# Patient Record
Sex: Female | Born: 1960 | Race: White | Hispanic: No | Marital: Married | Smoking: Never smoker
Health system: Southern US, Community
[De-identification: ages and names within clinical notes are randomized; demographics above are authoritative.]

## PROBLEM LIST (undated history)

## (undated) DIAGNOSIS — Z87442 Personal history of urinary calculi: Secondary | ICD-10-CM

## (undated) DIAGNOSIS — G4733 Obstructive sleep apnea (adult) (pediatric): Secondary | ICD-10-CM

## (undated) DIAGNOSIS — E785 Hyperlipidemia, unspecified: Secondary | ICD-10-CM

## (undated) DIAGNOSIS — I272 Pulmonary hypertension, unspecified: Secondary | ICD-10-CM

## (undated) DIAGNOSIS — I493 Ventricular premature depolarization: Secondary | ICD-10-CM

## (undated) DIAGNOSIS — G43909 Migraine, unspecified, not intractable, without status migrainosus: Secondary | ICD-10-CM

## (undated) HISTORY — PX: SALPINGOOPHORECTOMY: SHX82

## (undated) HISTORY — DX: Hyperlipidemia, unspecified: E78.5

## (undated) HISTORY — DX: Pulmonary hypertension, unspecified: I27.20

## (undated) HISTORY — DX: Obstructive sleep apnea (adult) (pediatric): G47.33

## (undated) HISTORY — PX: LITHOTRIPSY: SUR834

---

## 1997-11-28 ENCOUNTER — Ambulatory Visit (HOSPITAL_COMMUNITY): Admission: RE | Admit: 1997-11-28 | Discharge: 1997-11-28 | Payer: Self-pay | Admitting: Urology

## 2006-09-29 ENCOUNTER — Ambulatory Visit (HOSPITAL_COMMUNITY): Admission: RE | Admit: 2006-09-29 | Discharge: 2006-09-29 | Payer: Self-pay | Admitting: Family Medicine

## 2007-10-23 ENCOUNTER — Ambulatory Visit (HOSPITAL_COMMUNITY): Admission: RE | Admit: 2007-10-23 | Discharge: 2007-10-23 | Payer: Self-pay | Admitting: Family Medicine

## 2013-03-12 ENCOUNTER — Other Ambulatory Visit: Payer: Self-pay | Admitting: *Deleted

## 2013-03-12 ENCOUNTER — Telehealth: Payer: Self-pay | Admitting: Family Medicine

## 2013-03-12 MED ORDER — CIPROFLOXACIN HCL 500 MG PO TABS: 500.0000 mg | ORAL_TABLET | Freq: Two times a day (BID) | ORAL | Status: AC

## 2013-03-12 NOTE — Telephone Encounter (Signed)
Pt is calling and requesting some Cipro to be called into [facility], Eber Jones has told the pt that if she gets another UTI to just call in and get [place] to call her in some cipro. Pt says to see chart for history. Advised pt that Dr Brett Canales or Dr Lorin Picket will normally have you come in for an appt before issuing a Antibiotic, that she may need to come in for an appt.

## 2013-03-12 NOTE — Telephone Encounter (Signed)
Med sent into [facility]. Pt notified

## 2013-03-12 NOTE — Telephone Encounter (Signed)
cipro 250 bid for 7 d

## 2013-05-22 ENCOUNTER — Ambulatory Visit (INDEPENDENT_AMBULATORY_CARE_PROVIDER_SITE_OTHER): Payer: 59 | Admitting: Orthopedic Surgery

## 2013-05-22 ENCOUNTER — Ambulatory Visit (INDEPENDENT_AMBULATORY_CARE_PROVIDER_SITE_OTHER): Payer: 59

## 2013-05-22 VITALS — BP 133/73 | Ht 65.5 in | Wt 143.0 lb

## 2013-05-22 DIAGNOSIS — M2540 Effusion, unspecified joint: Secondary | ICD-10-CM

## 2013-05-22 NOTE — Patient Instructions (Addendum)
REFER TO DR. GRAMIG LEFT LONG FINGER PIP JOINT SWELLING AND PAIN

## 2013-05-24 NOTE — Progress Notes (Signed)
Patient ID: Loretta Carpenter, female   DOB: 1961/06/13, 52 y.o.   MRN: [id number]  Chief Complaint  Patient presents with  . Hand Pain    Swelling of joint left long finger, no injury    HISTORY: 52 yo female long history of OA has swelling of the left long finger, with ? Soft tissue mass over the volar aspect of the finger with pain and swelling of the PIP joint and a small amount of joint motion loss.  ROS as recorded    Vital signs are stable as recorded/BP 133/73  Ht 5' 5.5" (1.664 m)  Wt 143 lb (64.864 kg)  BMI 23.43 kg/m2  General appearance is normal, body habitus normal   The patient is alert and oriented x 3  The patient's mood and affect are normal  Gait assessment: normal   The cardiovascular exam reveals normal pulses and temperature without edema or  swelling.  The lymphatic system is negative for palpable lymph nodes  The sensory exam is normal.  There are no pathologic reflexes.  Balance is normal.   Exam of the hands   Inspection multiple Heberden's and Bouchard's nodes , tenderness and swelling at the PIP joint  Range of motion of the PIP joint decreased Stability normal  Skin normal, no rash, or laceration.   X-rays no fracture but severe OA of the joint PIP left hand long finger  I don't see any tumor just OA The soft tissue may have a mass   REC CONSULT with Dr Butler Denmark

## 2013-05-25 ENCOUNTER — Other Ambulatory Visit: Payer: Self-pay | Admitting: *Deleted

## 2013-05-25 ENCOUNTER — Telehealth: Payer: Self-pay | Admitting: *Deleted

## 2013-05-25 DIAGNOSIS — M2540 Effusion, unspecified joint: Secondary | ICD-10-CM

## 2013-05-25 NOTE — Telephone Encounter (Signed)
Faxed referral and office notes to  Orthopedics. Awaiting appointment. 

## 2013-06-08 ENCOUNTER — Telehealth: Payer: Self-pay | Admitting: Family Medicine

## 2013-06-08 ENCOUNTER — Other Ambulatory Visit: Payer: Self-pay | Admitting: Nurse Practitioner

## 2013-06-08 DIAGNOSIS — N912 Amenorrhea, unspecified: Secondary | ICD-10-CM

## 2013-06-08 DIAGNOSIS — R5381 Other malaise: Secondary | ICD-10-CM

## 2013-06-08 DIAGNOSIS — Z0000 Encounter for general adult medical examination without abnormal findings: Secondary | ICD-10-CM

## 2013-06-08 NOTE — Telephone Encounter (Signed)
Patient has appointment for physical mid Nov. Would like to have BW ordered that was mentioned by Eber Jones in 2011 that she did not have done. Lipid, TSH, Met 7, CBC w/ diff, Vita D level, FSH, LH, and Estradiol level.

## 2013-06-25 NOTE — Telephone Encounter (Signed)
Called patient, and made her aware that [facility] has been trying to reach her to schedule an appointment. The patient states she will contact them to schedule.

## 2013-07-11 ENCOUNTER — Other Ambulatory Visit: Payer: Self-pay | Admitting: Family Medicine

## 2013-07-11 DIAGNOSIS — Z139 Encounter for screening, unspecified: Secondary | ICD-10-CM

## 2013-07-12 LAB — BASIC METABOLIC PANEL
Glucose, Bld: 84 mg/dL (ref 70–99)
Potassium: 4 mEq/L (ref 3.5–5.3)
Sodium: 138 mEq/L (ref 135–145)

## 2013-07-12 LAB — CBC WITH DIFFERENTIAL/PLATELET
Basophils Relative: 1 % (ref 0–1)
Eosinophils Relative: 3 % (ref 0–5)
HCT: 41.3 % (ref 36.0–46.0)
MCHC: 33.9 g/dL (ref 30.0–36.0)
MCV: 84.3 fL (ref 78.0–100.0)
Monocytes Relative: 9 % (ref 3–12)
Neutro Abs: 4 10*3/uL (ref 1.7–7.7)
Neutrophils Relative %: 61 % (ref 43–77)
Platelets: 214 10*3/uL (ref 150–400)
RBC: 4.9 MIL/uL (ref 3.87–5.11)

## 2013-07-12 LAB — HEPATIC FUNCTION PANEL
AST: 18 U/L (ref 0–37)
Albumin: 4.4 g/dL (ref 3.5–5.2)
Alkaline Phosphatase: 72 U/L (ref 39–117)
Bilirubin, Direct: 0.1 mg/dL (ref 0.0–0.3)
Indirect Bilirubin: 0.9 mg/dL (ref 0.0–0.9)

## 2013-07-12 LAB — LIPID PANEL
Cholesterol: 201 mg/dL — ABNORMAL HIGH (ref 0–200)
HDL: 48 mg/dL (ref >39)
VLDL: 28 mg/dL (ref 0–40)

## 2013-07-13 ENCOUNTER — Encounter: Payer: Self-pay | Admitting: Nurse Practitioner

## 2013-07-13 ENCOUNTER — Ambulatory Visit (INDEPENDENT_AMBULATORY_CARE_PROVIDER_SITE_OTHER): Payer: 59 | Admitting: Nurse Practitioner

## 2013-07-13 VITALS — BP 136/82 | Ht 64.0 in | Wt 145.8 lb

## 2013-07-13 DIAGNOSIS — N390 Urinary tract infection, site not specified: Secondary | ICD-10-CM

## 2013-07-13 DIAGNOSIS — Z01419 Encounter for gynecological examination (general) (routine) without abnormal findings: Secondary | ICD-10-CM

## 2013-07-13 DIAGNOSIS — E559 Vitamin D deficiency, unspecified: Secondary | ICD-10-CM

## 2013-07-13 DIAGNOSIS — Z0000 Encounter for general adult medical examination without abnormal findings: Secondary | ICD-10-CM

## 2013-07-13 DIAGNOSIS — Z780 Asymptomatic menopausal state: Secondary | ICD-10-CM

## 2013-07-13 DIAGNOSIS — Z124 Encounter for screening for malignant neoplasm of cervix: Secondary | ICD-10-CM

## 2013-07-13 LAB — FOLLICLE STIMULATING HORMONE: FSH: 92.9 m[IU]/mL

## 2013-07-13 LAB — LUTEINIZING HORMONE: LH: 40.3 m[IU]/mL

## 2013-07-13 MED ORDER — CIPROFLOXACIN HCL 500 MG PO TABS: 500.0000 mg | ORAL_TABLET | Freq: Two times a day (BID) | ORAL | Status: DC

## 2013-07-13 MED ORDER — VITAMIN D (ERGOCALCIFEROL) 1.25 MG (50000 UNIT) PO CAPS: 50000.0000 [IU] | ORAL_CAPSULE | ORAL | Status: DC

## 2013-07-16 LAB — PAP IG W/ RFLX HPV ASCU

## 2013-07-21 ENCOUNTER — Encounter: Payer: Self-pay | Admitting: Nurse Practitioner

## 2013-07-21 DIAGNOSIS — N390 Urinary tract infection, site not specified: Secondary | ICD-10-CM | POA: Insufficient documentation

## 2013-07-21 DIAGNOSIS — Z780 Asymptomatic menopausal state: Secondary | ICD-10-CM | POA: Insufficient documentation

## 2013-07-21 DIAGNOSIS — E559 Vitamin D deficiency, unspecified: Secondary | ICD-10-CM | POA: Insufficient documentation

## 2013-07-21 NOTE — Assessment & Plan Note (Signed)
.   Vitamin D, Ergocalciferol, (DRISDOL) 50000 UNITS CAPS capsule    Sig: Take 1 capsule (50,000 Units total) by mouth every 7 (seven) days.    Dispense:  4 capsule    Refill:  1    Order Specific Question:  Supervising Provider    Answer:  LUKING, WILLIAM S [[id number]]  . ciprofloxacin (CIPRO) 500 MG tablet    Sig: Take 1 tablet (500 mg total) by mouth 2 (two) times daily.    Dispense:  14 tablet    Refill:  1    Order Specific Question:  Supervising Provider    Answer:  LUKING, WILLIAM S [[id number]]   Patient to call if urinary symptoms do not respond to Cipro. Encouraged healthy diet and regular activity. OTC vitamin D 1000 units daily once her prescription vitamin D is complete. Encouraged an eye exam sometime next year. Next physical in one year. 

## 2013-07-21 NOTE — Assessment & Plan Note (Signed)
.   Vitamin D, Ergocalciferol, (DRISDOL) 50000 UNITS CAPS capsule    Sig: Take 1 capsule (50,000 Units total) by mouth every 7 (seven) days.    Dispense:  4 capsule    Refill:  1    Order Specific Question:  Supervising Provider    Answer:  Merlyn Albert [[id number]]  . ciprofloxacin (CIPRO) 500 MG tablet    Sig: Take 1 tablet (500 mg total) by mouth 2 (two) times daily.    Dispense:  14 tablet    Refill:  1    Order Specific Question:  Supervising Provider    Answer:  Merlyn Albert [[id number]]   Patient to call if urinary symptoms do not respond to Cipro. Encouraged healthy diet and regular activity. OTC vitamin D 1000 units daily once her prescription vitamin D is complete. Encouraged an eye exam sometime next year. Next physical in one year.

## 2013-07-21 NOTE — Progress Notes (Signed)
Subjective:    Patient ID: Loretta Carpenter, female    DOB: 30-Sep-1960, 52 y.o.   MRN: [id number]  HPI presents for wellness checkup. BP outside of our office at work runs very well at last check 113/85. Is due for her regular eye exam. Gets regular dental exams. Patient is scheduled for a colonoscopy with Dr. Karilyn Cota. Her mammogram is due in December. Married, same sexual partner. No pelvic pain. No vaginal bleeding. Has a history of occasional UTI, requesting a prescription for Cipro as a backup plan. No current symptoms.    Review of Systems  Constitutional: Negative for fever, activity change, appetite change and fatigue.  HENT: Positive for rhinorrhea. Negative for dental problem, ear pain, hearing loss and sore throat.   Eyes: Negative for visual disturbance.  Respiratory: Negative for cough, chest tightness, shortness of breath and wheezing.   Cardiovascular: Negative for chest pain.  Gastrointestinal: Negative for nausea, vomiting, abdominal pain, diarrhea, constipation and blood in stool.  Genitourinary: Negative for dysuria, urgency, frequency, hematuria, vaginal bleeding, vaginal discharge, enuresis, difficulty urinating and pelvic pain.       Objective:   Physical Exam  Vitals reviewed. Constitutional: She is oriented to person, place, and time. She appears well-developed. No distress.  HENT:  Right Ear: External ear normal.  Left Ear: External ear normal.  Mouth/Throat: Oropharynx is clear and moist.  Neck: Normal range of motion. Neck supple. No tracheal deviation present. No thyromegaly present.  Cardiovascular: Normal rate, regular rhythm and normal heart sounds.  Exam reveals no gallop.   No murmur heard. Pulmonary/Chest: Effort normal and breath sounds normal.  Abdominal: Soft. She exhibits no distension. There is no tenderness.  Genitourinary: Vagina normal and uterus normal. No vaginal discharge found.  Musculoskeletal: She exhibits no edema.  Lymphadenopathy:    She  has no cervical adenopathy.  Neurological: She is alert and oriented to person, place, and time.  Skin: Skin is warm and dry. No rash noted.  Psychiatric: She has a normal mood and affect. Her behavior is normal.   Breast exam: No masses noted, axilla no adenopathy. External GU normal. Vagina no discharge. Cervix normal in appearance. No CMT. Bimanual exam: No masses or tenderness noted. Reviewed lab work from 11/13 the patient. Hormone levels indicate that patient is postmenopausal.       Assessment & Plan:  Well woman exam  Screening for cervical cancer - Plan: Pap IG w/ reflex to HPV when ASC-U  Routine general medical examination at a health care facility - Plan: Pap IG w/ reflex to HPV when ASC-U  Vitamin D insufficiency  Recurrent UTI  Meds ordered this encounter  Medications  . fish oil-omega-3 fatty acids 1000 MG capsule    Sig: Take 2 g by mouth daily.  [name] UNABLE TO FIND    Sig: Tumeric  . Vitamin D, Ergocalciferol, (DRISDOL) 50000 UNITS CAPS capsule    Sig: Take 1 capsule (50,000 Units total) by mouth every 7 (seven) days.    Dispense:  4 capsule    Refill:  1    Order Specific Question:  Supervising Provider    Answer:  Merlyn Albert [[id number]]  . ciprofloxacin (CIPRO) 500 MG tablet    Sig: Take 1 tablet (500 mg total) by mouth 2 (two) times daily.    Dispense:  14 tablet    Refill:  1    Order Specific Question:  Supervising Provider    Answer:  Merlyn Albert [[id number]]   Patient to  call if urinary symptoms do not respond to Cipro. Encouraged healthy diet and regular activity. OTC vitamin D 1000 units daily once her prescription vitamin D is complete. Encouraged an eye exam sometime next year. Next physical in one year.

## 2013-07-30 ENCOUNTER — Ambulatory Visit (HOSPITAL_COMMUNITY)
Admission: RE | Admit: 2013-07-30 | Discharge: 2013-07-30 | Disposition: A | Discharge status: Home / Self Care | Payer: 59 | Source: Ambulatory Visit | Attending: Family Medicine | Admitting: Family Medicine

## 2013-07-30 DIAGNOSIS — Z1231 Encounter for screening mammogram for malignant neoplasm of breast: Secondary | ICD-10-CM | POA: Insufficient documentation

## 2013-07-30 DIAGNOSIS — Z139 Encounter for screening, unspecified: Secondary | ICD-10-CM

## 2013-09-12 ENCOUNTER — Telehealth: Payer: Self-pay | Admitting: Gastroenterology

## 2013-09-12 MED ORDER — CYCLOBENZAPRINE HCL 10 MG PO TABS: 10.0000 mg | ORAL_TABLET | Freq: Three times a day (TID) | ORAL | Status: DC | PRN

## 2013-09-12 MED ORDER — MELOXICAM 15 MG PO TABS: 15.0000 mg | ORAL_TABLET | Freq: Every day | ORAL | Status: DC

## 2013-09-12 NOTE — Telephone Encounter (Signed)
PT HAVING SEVERE BACK PAIN/SPASM. ICE TID. BALL TID. MOBIC 15 MG DAILY. FLEXERIL 10 MG TID PRN TO RELAX SPASM.

## 2014-03-14 ENCOUNTER — Emergency Department (HOSPITAL_COMMUNITY)
Admission: EM | Admit: 2014-03-14 | Discharge: 2014-03-14 | Disposition: A | Discharge status: Home / Self Care | Payer: 59 | Attending: Emergency Medicine | Admitting: Emergency Medicine

## 2014-03-14 ENCOUNTER — Telehealth: Payer: Self-pay | Admitting: Family Medicine

## 2014-03-14 ENCOUNTER — Emergency Department (HOSPITAL_COMMUNITY): Payer: 59

## 2014-03-14 ENCOUNTER — Other Ambulatory Visit: Payer: Self-pay | Admitting: Nurse Practitioner

## 2014-03-14 ENCOUNTER — Encounter (HOSPITAL_COMMUNITY): Payer: Self-pay | Admitting: Emergency Medicine

## 2014-03-14 DIAGNOSIS — N201 Calculus of ureter: Secondary | ICD-10-CM | POA: Insufficient documentation

## 2014-03-14 DIAGNOSIS — Z880 Allergy status to penicillin: Secondary | ICD-10-CM | POA: Insufficient documentation

## 2014-03-14 DIAGNOSIS — Z79899 Other long term (current) drug therapy: Secondary | ICD-10-CM | POA: Insufficient documentation

## 2014-03-14 LAB — CBC WITH DIFFERENTIAL/PLATELET
BASOS ABS: 0 10*3/uL (ref 0.0–0.1)
BASOS PCT: 0 % (ref 0–1)
EOS ABS: 0.1 10*3/uL (ref 0.0–0.7)
Eosinophils Relative: 2 % (ref 0–5)
HEMATOCRIT: 42.2 % (ref 36.0–46.0)
Hemoglobin: 14.1 g/dL (ref 12.0–15.0)
LYMPHS ABS: 1.9 10*3/uL (ref 0.7–4.0)
LYMPHS PCT: 37 % (ref 12–46)
MCH: 28.8 pg (ref 26.0–34.0)
MCHC: 33.4 g/dL (ref 30.0–36.0)
MCV: 86.3 fL (ref 78.0–100.0)
MONOS PCT: 7 % (ref 3–12)
Monocytes Absolute: 0.4 10*3/uL (ref 0.1–1.0)
NEUTROS ABS: 2.7 10*3/uL (ref 1.7–7.7)
NEUTROS PCT: 54 % (ref 43–77)
PLATELETS: 181 10*3/uL (ref 150–400)
RBC: 4.89 MIL/uL (ref 3.87–5.11)
RDW: 13.1 % (ref 11.5–15.5)
WBC: 5.1 10*3/uL (ref 4.0–10.5)

## 2014-03-14 LAB — URINALYSIS, ROUTINE W REFLEX MICROSCOPIC
Bilirubin Urine: NEGATIVE
Glucose, UA: NEGATIVE mg/dL
Ketones, ur: NEGATIVE mg/dL
Nitrite: NEGATIVE
Protein, ur: NEGATIVE mg/dL
Specific Gravity, Urine: 1.01 (ref 1.005–1.030)
Urobilinogen, UA: 0.2 mg/dL (ref 0.0–1.0)
pH: 6.5 (ref 5.0–8.0)

## 2014-03-14 LAB — BASIC METABOLIC PANEL
ANION GAP: 18 — AB (ref 5–15)
BUN: 22 mg/dL (ref 6–23)
CALCIUM: 9.4 mg/dL (ref 8.4–10.5)
CHLORIDE: 104 meq/L (ref 96–112)
CO2: 20 meq/L (ref 19–32)
Creatinine, Ser: 0.71 mg/dL (ref 0.50–1.10)
GFR calc Af Amer: >90 mL/min (ref >90)
GFR calc non Af Amer: >90 mL/min (ref >90)
Glucose, Bld: 119 mg/dL — ABNORMAL HIGH (ref 70–99)
Potassium: 3.6 mEq/L — ABNORMAL LOW (ref 3.7–5.3)
Sodium: 142 mEq/L (ref 137–147)

## 2014-03-14 LAB — URINE MICROSCOPIC-ADD ON

## 2014-03-14 MED ORDER — ONDANSETRON HCL 4 MG/2ML IJ SOLN
4.0000 mg | Freq: Once | INTRAMUSCULAR | Status: AC
  Administered 2014-03-14: 4 mg via INTRAVENOUS
  Filled 2014-03-14: qty 2

## 2014-03-14 MED ORDER — ONDANSETRON HCL 4 MG PO TABS: 4.0000 mg | ORAL_TABLET | Freq: Four times a day (QID) | ORAL | Status: DC | PRN

## 2014-03-14 MED ORDER — ONDANSETRON HCL 4 MG/2ML IJ SOLN
4.0000 mg | Freq: Once | INTRAMUSCULAR | Status: AC
  Administered 2014-03-14: 4 mg via INTRAVENOUS

## 2014-03-14 MED ORDER — KETOROLAC TROMETHAMINE 30 MG/ML IJ SOLN
30.0000 mg | Freq: Once | INTRAMUSCULAR | Status: AC
  Administered 2014-03-14: 30 mg via INTRAVENOUS
  Filled 2014-03-14: qty 1

## 2014-03-14 MED ORDER — OXYCODONE-ACETAMINOPHEN 5-325 MG PO TABS: 1.0000 | ORAL_TABLET | ORAL | Status: DC | PRN

## 2014-03-14 MED ORDER — SODIUM CHLORIDE 0.9 % IV SOLN
Freq: Once | INTRAVENOUS | Status: AC
  Administered 2014-03-14: 04:00:00 via INTRAVENOUS

## 2014-03-14 MED ORDER — HYDROMORPHONE HCL PF 1 MG/ML IJ SOLN
1.0000 mg | Freq: Once | INTRAMUSCULAR | Status: AC
  Administered 2014-03-14: 1 mg via INTRAVENOUS

## 2014-03-14 MED ORDER — HYDROMORPHONE HCL PF 1 MG/ML IJ SOLN
1.0000 mg | Freq: Once | INTRAMUSCULAR | Status: AC
  Administered 2014-03-14: 1 mg via INTRAVENOUS
  Filled 2014-03-14: qty 1

## 2014-03-14 MED ORDER — HYDROMORPHONE HCL PF 1 MG/ML IJ SOLN
INTRAMUSCULAR | Status: AC
  Filled 2014-03-14: qty 1

## 2014-03-14 MED ORDER — ONDANSETRON HCL 4 MG/2ML IJ SOLN
INTRAMUSCULAR | Status: AC
  Administered 2014-03-14: 4 mg via INTRAVENOUS
  Filled 2014-03-14: qty 2

## 2014-03-14 MED ORDER — PROMETHAZINE HCL 25 MG RE SUPP
25.0000 mg | Freq: Four times a day (QID) | RECTAL | Status: DC | PRN
Start: 2014-03-14 — End: 2014-08-09

## 2014-03-14 NOTE — ED Provider Notes (Signed)
CSN: [id number]     Arrival date & time 03/14/14  0406 History   First MD Initiated Contact with Patient 03/14/14 [phone]     Chief Complaint  Patient presents with  . Flank Pain     (Consider location/radiation/quality/duration/timing/severity/associated sxs/prior Treatment) The history is provided by the patient.   53 year old female comes in with onset about 3 AM of severe left lower abdominal pain with associated nausea and vomiting. Pain woke her up and has been severe and unrelenting since then. Nothing makes it better nothing makes it worse. She thinks there may be some radiation to the back but is unsure. She denies fever or chills but has had sweats. She denies any urinary symptoms. She's never had pain like this before. She was unable to take anything at home because of vomiting.  No past medical history on file. No past surgical history on file. No family history on file. History  Substance Use Topics  . Smoking status: Never Smoker   . Smokeless tobacco: Not on file  . Alcohol Use: Not on file   OB History   Grav Para Term Preterm Abortions TAB SAB Ect Mult Living                 Review of Systems  All other systems reviewed and are negative.     Allergies  Penicillins  Home Medications   Prior to Admission medications   Medication Sig Start Date End Date Taking? Authorizing Provider  ciprofloxacin (CIPRO) 500 MG tablet Take 1 tablet (500 mg total) by mouth 2 (two) times daily. 07/13/13   Campbell Richesarolyn C Hoskins, NP  cyclobenzaprine (FLEXERIL) 10 MG tablet Take 1 tablet (10 mg total) by mouth 3 (three) times daily as needed for muscle spasms. 09/12/13   West BaliSandi L Fields, MD  fish oil-omega-3 fatty acids 1000 MG capsule Take 2 g by mouth daily.    Historical Provider, MD  meloxicam (MOBIC) 15 MG tablet Take 1 tablet (15 mg total) by mouth daily. 09/12/13   West BaliSandi L Fields, MD  UNABLE TO FIND Tumeric    Historical Provider, MD  Vitamin D, Ergocalciferol, (DRISDOL) 50000 UNITS  CAPS capsule Take 1 capsule (50,000 Units total) by mouth every 7 (seven) days. 07/13/13   Campbell Richesarolyn C Hoskins, NP   BP 99/65  Pulse 77  Resp 20  SpO2 93% Physical Exam  Nursing note and vitals reviewed.  53 year old female, who is writhing in pain on the stretcher, but his in no acute distress. Vital signs are normal. Oxygen saturation is 93%, which is normal. Head is normocephalic and atraumatic. PERRLA, EOMI. Oropharynx is clear. Neck is nontender and supple without adenopathy or JVD. Back is nontender in the midline. There is mild left CVA tenderness. Lungs are clear without rales, wheezes, or rhonchi. Chest is nontender. Heart has regular rate and rhythm without murmur. Abdomen is soft, flat, with mild left lower quadrant tenderness. There is no rebound or guarding. There are no masses or hepatosplenomegaly and peristalsis is hypoactive. Extremities have no cyanosis or edema, full range of motion is present. Skin is warm and dry without rash. Neurologic: Mental status is normal, cranial nerves are intact, there are no motor or sensory deficits.  ED Course  Procedures (including critical care time) Labs Review Results for orders placed during the hospital encounter of 03/14/14  CBC WITH DIFFERENTIAL      Result Value Ref Range   WBC 5.1  4.0 - 10.5 K/uL   RBC 4.89  3.87 - 5.11 MIL/uL   Hemoglobin 14.1  12.0 - 15.0 g/dL   HCT 16.1  09.6 - 04.5 %   MCV 86.3  78.0 - 100.0 fL   MCH 28.8  26.0 - 34.0 pg   MCHC 33.4  30.0 - 36.0 g/dL   RDW 40.9  81.1 - 91.4 %   Platelets 181  150 - 400 K/uL   Neutrophils Relative % 54  43 - 77 %   Neutro Abs 2.7  1.7 - 7.7 K/uL   Lymphocytes Relative 37  12 - 46 %   Lymphs Abs 1.9  0.7 - 4.0 K/uL   Monocytes Relative 7  3 - 12 %   Monocytes Absolute 0.4  0.1 - 1.0 K/uL   Eosinophils Relative 2  0 - 5 %   Eosinophils Absolute 0.1  0.0 - 0.7 K/uL   Basophils Relative 0  0 - 1 %   Basophils Absolute 0.0  0.0 - 0.1 K/uL  BASIC METABOLIC PANEL       Result Value Ref Range   Sodium 142  137 - 147 mEq/L   Potassium 3.6 (*) 3.7 - 5.3 mEq/L   Chloride 104  96 - 112 mEq/L   CO2 20  19 - 32 mEq/L   Glucose, Bld 119 (*) 70 - 99 mg/dL   BUN 22  6 - 23 mg/dL   Creatinine, Ser 7.82  0.50 - 1.10 mg/dL   Calcium 9.4  8.4 - 95.6 mg/dL   GFR calc non Af Amer >90  >90 mL/min   GFR calc Af Amer >90  >90 mL/min   Anion gap 18 (*) 5 - 15  URINALYSIS, ROUTINE W REFLEX MICROSCOPIC      Result Value Ref Range   Color, Urine YELLOW  YELLOW   APPearance CLEAR  CLEAR   Specific Gravity, Urine 1.010  1.005 - 1.030   pH 6.5  5.0 - 8.0   Glucose, UA NEGATIVE  NEGATIVE mg/dL   Hgb urine dipstick LARGE (*) NEGATIVE   Bilirubin Urine NEGATIVE  NEGATIVE   Ketones, ur NEGATIVE  NEGATIVE mg/dL   Protein, ur NEGATIVE  NEGATIVE mg/dL   Urobilinogen, UA 0.2  0.0 - 1.0 mg/dL   Nitrite NEGATIVE  NEGATIVE   Leukocytes, UA TRACE (*) NEGATIVE  URINE MICROSCOPIC-ADD ON      Result Value Ref Range   Squamous Epithelial / LPF FEW (*) RARE   WBC, UA 3-6  <3 WBC/hpf   RBC / HPF TOO NUMEROUS TO COUNT  <3 RBC/hpf   Bacteria, UA FEW (*) RARE   Imaging Review Ct Abdomen Pelvis Wo Contrast  03/14/2014   CLINICAL DATA:  Left lower quadrant/flank pain.  EXAM: CT ABDOMEN AND PELVIS WITHOUT CONTRAST  TECHNIQUE: Multidetector CT imaging of the abdomen and pelvis was performed following the standard protocol without IV contrast.  COMPARISON:  None.  FINDINGS: LUNG BASES: Included view of the lung bases are clear. The visualized heart and pericardium are unremarkable.  KIDNEYS/BLADDER: Kidneys are orthotopic, demonstrating normal size and morphology. No nephrolithiasis; limited assessment for renal masses on this nonenhanced examination. Mild left hydroureteronephrosis to the level of the ureterovesicular junction were 3 mm calculus is seen. Urinary bladder is partially distended and otherwise unremarkable.  SOLID ORGANS: The liver, spleen, gallbladder, pancreas and adrenal glands  are unremarkable for this non-contrast examination.  GASTROINTESTINAL TRACT: The stomach, small and large bowel are normal in course and caliber without inflammatory changes, the sensitivity may be decreased by  lack of enteric contrast. Sigmoid diverticulosis. Mild amount of retained large bowel stool. Normal appendix.  PERITONEUM/RETROPERITONEUM: No intraperitoneal free fluid nor free air. Aortoiliac vessels are normal in course and caliber. No lymphadenopathy by CT size criteria. Internal reproductive organs are unremarkable. Phleboliths in the pelvis.  SOFT TISSUES/ OSSEOUS STRUCTURES: Nonsuspicious. 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 Scattered subcentimeter presumed bone islands in the pelvis. Small to moderate fat containing umbilical hernia.  IMPRESSION: Mild left hydroureteronephrosis to the level of the ureterovesicular junction where a 3 mm calculus is seen. No residual nephrolithiasis.   Electronically Signed   By: Awilda Metro   On: 03/14/2014 05:38   Images viewed by me.  MDM   Final diagnoses:  Ureterolithiasis    Left-sided pain which seems most consistent with ureteral colic. She will be sent for CT scan for definitive evaluation. She is given hydromorphone for pain and ondansetron for nausea. Review of old records shows no relevant past visits, no prior abdominal CT scans.  CT shows a distal left ureteral calculus-almost in the bladder. She did require a second injection of hydromorphone but got complete relief of pain following that. She also received an injection of ketorolac once it was clear that surgical procedure was not likely to be needed. She didn't develop a recurrent nausea when she tried to stand up and was given a second dose of ondansetron. Now she is virtually pain-free although still complaining of a slight tingling in her left lower quadrant. She will be discharged with prescriptions for oxycodone and  acetaminophen and ondansetron. With calculus almost out of the ureter, I do not believe she would benefit from tamsulosin. She is referred back to her PCP and to on-call urology.  Dione Booze, MD 03/14/14 [phone]

## 2014-03-14 NOTE — Telephone Encounter (Signed)
Yes will send these in. 

## 2014-03-14 NOTE — ED Notes (Signed)
Patient with no complaints at this time. Respirations even and unlabored. Skin warm/dry. Discharge instructions reviewed with patient at this time. Patient given opportunity to voice concerns/ask questions. IV removed per policy and band-aid applied to site. Patient discharged at this time and left Emergency Department with steady gait.  

## 2014-03-14 NOTE — Telephone Encounter (Signed)
Patient notified

## 2014-03-14 NOTE — ED Notes (Signed)
Left flank pain with left lower abd pain, nausea, vomiting

## 2014-03-14 NOTE — Discharge Instructions (Signed)
Kidney Stones °Kidney stones (urolithiasis) are deposits that form inside your kidneys. The intense pain is caused by the stone moving through the urinary tract. When the stone moves, the ureter goes into spasm around the stone. The stone is usually passed in the urine.  °CAUSES  °· A disorder that makes certain neck glands produce too much parathyroid hormone (primary hyperparathyroidism). °· A buildup of uric acid crystals, similar to gout in your joints. °· Narrowing (stricture) of the ureter. °· A kidney obstruction present at birth (congenital obstruction). °· Previous surgery on the kidney or ureters. °· Numerous kidney infections. °SYMPTOMS  °· Feeling sick to your stomach (nauseous). °· Throwing up (vomiting). °· Blood in the urine (hematuria). °· Pain that usually spreads (radiates) to the groin. °· Frequency or urgency of urination. °DIAGNOSIS  °· Taking a history and physical exam. °· Blood or urine tests. °· CT scan. °· Occasionally, an examination of the inside of the urinary bladder (cystoscopy) is performed. °TREATMENT  °· Observation. °· Increasing your fluid intake. °· Extracorporeal shock wave lithotripsy--This is a noninvasive procedure that uses shock waves to break up kidney stones. °· Surgery may be needed if you have severe pain or persistent obstruction. There are various surgical procedures. Most of the procedures are performed with the use of small instruments. Only small incisions are needed to accommodate these instruments, so recovery time is minimized. °The size, location, and chemical composition are all important variables that will determine the proper choice of action for you. Talk to your health care provider to better understand your situation so that you will minimize the risk of injury to yourself and your kidney.  °HOME CARE INSTRUCTIONS  °· Drink enough water and fluids to keep your urine clear or pale yellow. This will help you to pass the stone or stone fragments. °· Strain  all urine through the provided strainer. Keep all particulate matter and stones for your health care provider to see. The stone causing the pain may be as small as a grain of salt. It is very important to use the strainer each and every time you pass your urine. The collection of your stone will allow your health care provider to analyze it and verify that a stone has actually passed. The stone analysis will often identify what you can do to reduce the incidence of recurrences. °· Only take over-the-counter or prescription medicines for pain, discomfort, or fever as directed by your health care provider. °· Make a follow-up appointment with your health care provider as directed. °· Get follow-up X-rays if required. The absence of pain does not always mean that the stone has passed. It may have only stopped moving. If the urine remains completely obstructed, it can cause loss of kidney function or even complete destruction of the kidney. It is your responsibility to make sure X-rays and follow-ups are completed. Ultrasounds of the kidney can show blockages and the status of the kidney. Ultrasounds are not associated with any radiation and can be performed easily in a matter of minutes. °SEEK MEDICAL CARE IF: °· You experience pain that is progressive and unresponsive to any pain medicine you have been prescribed. °SEEK IMMEDIATE MEDICAL CARE IF:  °· Pain cannot be controlled with the prescribed medicine. °· You have a fever or shaking chills. °· The severity or intensity of pain increases over 18 hours and is not relieved by pain medicine. °· You develop a new onset of abdominal pain. °· You feel faint or pass out. °·   You are unable to urinate. °MAKE SURE YOU:  °· Understand these instructions. °· Will watch your condition. °· Will get help right away if you are not doing well or get worse. °Document Released: 08/16/2005 Document Revised: 04/18/2013 Document Reviewed: 01/17/2013 °ExitCare® Patient Information ©2015  [employer]. This information is not intended to replace advice given to you by your health care provider. Make sure you discuss any questions you have with your health care provider. ° °Acetaminophen; Oxycodone tablets °What is this medicine? °ACETAMINOPHEN; OXYCODONE (a set a MEE noe fen; ox i KOE done) is a pain reliever. It is used to treat mild to moderate pain. °This medicine may be used for other purposes; ask your health care provider or pharmacist if you have questions. °COMMON BRAND NAME(S): Endocet, Magnacet, Narvox, Percocet, Perloxx, Primalev, Primlev, Roxicet, Xolox °What should I tell my health care provider before I take this medicine? °They need to know if you have any of these conditions: °-brain tumor °-Crohn's disease, inflammatory bowel disease, or ulcerative colitis °-drug abuse or addiction °-head injury °-heart or circulation problems °-if you often drink alcohol °-kidney disease or problems going to the bathroom °-liver disease °-lung disease, asthma, or breathing problems °-an unusual or allergic reaction to acetaminophen, oxycodone, other opioid analgesics, other medicines, foods, dyes, or preservatives °-pregnant or trying to get pregnant °-breast-feeding °How should I use this medicine? °Take this medicine by mouth with a full glass of water. Follow the directions on the prescription label. Take your medicine at regular intervals. Do not take your medicine more often than directed. °Talk to your pediatrician regarding the use of this medicine in children. Special care may be needed. °Patients over 65 years old may have a stronger reaction and need a smaller dose. °Overdosage: If you think you have taken too much of this medicine contact a poison control center or emergency room at once. °NOTE: This medicine is only for you. Do not share this medicine with others. °What if I miss a dose? °If you miss a dose, take it as soon as you can. If it is almost time for your next dose, take only  that dose. Do not take double or extra doses. °What may interact with this medicine? °-alcohol °-antihistamines °-barbiturates like amobarbital, butalbital, butabarbital, methohexital, pentobarbital, phenobarbital, thiopental, and secobarbital °-benztropine °-drugs for bladder problems like solifenacin, trospium, oxybutynin, tolterodine, hyoscyamine, and methscopolamine °-drugs for breathing problems like ipratropium and tiotropium °-drugs for certain stomach or intestine problems like propantheline, homatropine methylbromide, glycopyrrolate, atropine, belladonna, and dicyclomine °-general anesthetics like etomidate, ketamine, nitrous oxide, propofol, desflurane, enflurane, halothane, isoflurane, and sevoflurane °-medicines for depression, anxiety, or psychotic disturbances °-medicines for sleep °-muscle relaxants °-naltrexone °-narcotic medicines (opiates) for pain °-phenothiazines like perphenazine, thioridazine, chlorpromazine, mesoridazine, fluphenazine, prochlorperazine, promazine, and trifluoperazine °-scopolamine °-tramadol °-trihexyphenidyl °This list may not describe all possible interactions. Give your health care provider a list of all the medicines, herbs, non-prescription drugs, or dietary supplements you use. Also tell them if you smoke, drink alcohol, or use illegal drugs. Some items may interact with your medicine. °What should I watch for while using this medicine? °Tell your doctor or health care professional if your pain does not go away, if it gets worse, or if you have new or a different type of pain. You may develop tolerance to the medicine. Tolerance means that you will need a higher dose of the medication for pain relief. Tolerance is normal and is expected if you take this medicine for a long time. °  Do not suddenly stop taking your medicine because you may develop a severe reaction. Your body becomes used to the medicine. This does NOT mean you are addicted. Addiction is a behavior related  to getting and using a drug for a non-medical reason. If you have pain, you have a medical reason to take pain medicine. Your doctor will tell you how much medicine to take. If your doctor wants you to stop the medicine, the dose will be slowly lowered over time to avoid any side effects. °You may get drowsy or dizzy. Do not drive, use machinery, or do anything that needs mental alertness until you know how this medicine affects you. Do not stand or sit up quickly, especially if you are an older patient. This reduces the risk of dizzy or fainting spells. Alcohol may interfere with the effect of this medicine. Avoid alcoholic drinks. °There are different types of narcotic medicines (opiates) for pain. If you take more than one type at the same time, you may have more side effects. Give your health care provider a list of all medicines you use. Your doctor will tell you how much medicine to take. Do not take more medicine than directed. Call emergency for help if you have problems breathing. °The medicine will cause constipation. Try to have a bowel movement at least every 2 to 3 days. If you do not have a bowel movement for 3 days, call your doctor or health care professional. °Do not take Tylenol (acetaminophen) or medicines that have acetaminophen with this medicine. Too much acetaminophen can be very dangerous. Many nonprescription medicines contain acetaminophen. Always read the labels carefully to avoid taking more acetaminophen. °What side effects may I notice from receiving this medicine? °Side effects that you should report to your doctor or health care professional as soon as possible: °-allergic reactions like skin rash, itching or hives, swelling of the face, lips, or tongue °-breathing difficulties, wheezing °-confusion °-light headedness or fainting spells °-severe stomach pain °-unusually weak or tired °-yellowing of the skin or the whites of the eyes °Side effects that usually do not require medical  attention (report to your doctor or health care professional if they continue or are bothersome): °-dizziness °-drowsiness °-nausea °-vomiting °This list may not describe all possible side effects. Call your doctor for medical advice about side effects. You may report side effects to FDA at 1-800-FDA-1088. °Where should I keep my medicine? °Keep out of the reach of children. This medicine can be abused. Keep your medicine in a safe place to protect it from theft. Do not share this medicine with anyone. Selling or giving away this medicine is dangerous and against the law. °Store at room temperature between 20 and 25 degrees C (68 and 77 degrees F). Keep container tightly closed. Protect from light. °This medicine may cause accidental overdose and death if it is taken by other adults, children, or pets. Flush any unused medicine down the toilet to reduce the chance of harm. Do not use the medicine after the expiration date. °NOTE: This sheet is a summary. It may not cover all possible information. If you have questions about this medicine, talk to your doctor, pharmacist, or health care provider. °© 2015, [employer]. (2013-04-09 13:17:35) ° °Ondansetron tablets °What is this medicine? °ONDANSETRON (on DAN se tron) is used to treat nausea and vomiting caused by chemotherapy. It is also used to prevent or treat nausea and vomiting after surgery. °This medicine may be used for other purposes; ask your   health care provider or pharmacist if you have questions. COMMON BRAND NAME(S): Zofran What should I tell my health care provider before I take this medicine? They need to know if you have any of these conditions: -heart disease -history of irregular heartbeat -liver disease -low levels of magnesium or potassium in the blood -an unusual or allergic reaction to ondansetron, granisetron, other medicines, foods, dyes, or preservatives -pregnant or trying to get pregnant -breast-feeding How should I use  this medicine? Take this medicine by mouth with a glass of water. Follow the directions on your prescription label. Take your doses at regular intervals. Do not take your medicine more often than directed. Talk to your pediatrician regarding the use of this medicine in children. Special care may be needed. Overdosage: If you think you have taken too much of this medicine contact a poison control center or emergency room at once. NOTE: This medicine is only for you. Do not share this medicine with others. What if I miss a dose? If you miss a dose, take it as soon as you can. If it is almost time for your next dose, take only that dose. Do not take double or extra doses. What may interact with this medicine? Do not take this medicine with any of the following medications: -apomorphine -certain medicines for fungal infections like fluconazole, itraconazole, ketoconazole, posaconazole, voriconazole -cisapride -dofetilide -dronedarone -pimozide -thioridazine -ziprasidone This medicine may also interact with the following medications: -carbamazepine -certain medicines for depression, anxiety, or psychotic disturbances -fentanyl -linezolid -MAOIs like Carbex, Eldepryl, Marplan, Nardil, and Parnate -methylene blue (injected into a vein) -other medicines that prolong the QT interval (cause an abnormal heart rhythm) -phenytoin -rifampicin -tramadol This list may not describe all possible interactions. Give your health care provider a list of all the medicines, herbs, non-prescription drugs, or dietary supplements you use. Also tell them if you smoke, drink alcohol, or use illegal drugs. Some items may interact with your medicine. What should I watch for while using this medicine? Check with your doctor or health care professional right away if you have any sign of an allergic reaction. What side effects may I notice from receiving this medicine? Side effects that you should report to your  doctor or health care professional as soon as possible: -allergic reactions like skin rash, itching or hives, swelling of the face, lips or tongue -breathing problems -confusion -dizziness -fast or irregular heartbeat -feeling faint or lightheaded, falls -fever and chills -loss of balance or coordination -seizures -sweating -swelling of the hands or feet -tightness in the chest -tremors -unusually weak or tired Side effects that usually do not require medical attention (report to your doctor or health care professional if they continue or are bothersome): -constipation or diarrhea -headache This list may not describe all possible side effects. Call your doctor for medical advice about side effects. You may report side effects to FDA at 1-800-FDA-1088. Where should I keep my medicine? Keep out of the reach of children. Store between 2 and 30 degrees C (36 and 86 degrees F). Throw away any unused medicine after the expiration date. NOTE: This sheet is a summary. It may not cover all possible information. If you have questions about this medicine, talk to your doctor, pharmacist, or health care provider.  2015, [employer] Standard. (2013-05-23 16:27:45)

## 2014-03-14 NOTE — Telephone Encounter (Signed)
Pt went to ER last night with kidney stones Was given zofran and oxycodone 5/325   Can't keep anything down at this point, wants to know if she can  Get some suppositories called in instead?   [facility]  reids   She is not getting any relief with the pain med due to being unable to keep it down

## 2014-03-18 ENCOUNTER — Telehealth: Payer: Self-pay | Admitting: Gastroenterology

## 2014-03-18 MED ORDER — ALPRAZOLAM 0.25 MG PO TABS: ORAL_TABLET | ORAL | Status: DC

## 2014-03-18 MED ORDER — SCOPOLAMINE 1 MG/3DAYS TD PT72: 1.0000 | MEDICATED_PATCH | TRANSDERMAL | Status: DC

## 2014-03-18 NOTE — Telephone Encounter (Signed)
PT NEEDS MEDS FOR A FLIGHT AND CRUISE.

## 2014-05-15 ENCOUNTER — Other Ambulatory Visit (INDEPENDENT_AMBULATORY_CARE_PROVIDER_SITE_OTHER): Payer: Self-pay | Admitting: *Deleted

## 2014-05-15 DIAGNOSIS — Z1211 Encounter for screening for malignant neoplasm of colon: Secondary | ICD-10-CM

## 2014-07-17 ENCOUNTER — Telehealth: Payer: Self-pay | Admitting: Family Medicine

## 2014-07-17 ENCOUNTER — Other Ambulatory Visit: Payer: Self-pay | Admitting: Nurse Practitioner

## 2014-07-17 NOTE — Telephone Encounter (Signed)
Patient last seen 07/19/13.

## 2014-07-17 NOTE — Telephone Encounter (Signed)
Pt states she tried to fill her Cipro on the 18th instead of the 14th an the pharmacy Told her she couldn't have the script since it was expired.  However upon looking at her meds it appears as though the med was sent to her  Pharmacy on the 14th from us?   Advise pt please

## 2014-07-17 NOTE — Telephone Encounter (Signed)
Patient advised she would need an office visit to get antibiotic. Patient verbalized understanding.

## 2014-07-24 ENCOUNTER — Telehealth (INDEPENDENT_AMBULATORY_CARE_PROVIDER_SITE_OTHER): Payer: Self-pay | Admitting: *Deleted

## 2014-07-24 ENCOUNTER — Encounter (INDEPENDENT_AMBULATORY_CARE_PROVIDER_SITE_OTHER): Payer: Self-pay | Admitting: *Deleted

## 2014-07-24 DIAGNOSIS — Z1211 Encounter for screening for malignant neoplasm of colon: Secondary | ICD-10-CM

## 2014-07-24 MED ORDER — SOD PICOSULFATE-MAG OX-CIT ACD 10-3.5-12 MG-GM-GM PO PACK: 1.0000 | PACK | Freq: Once | ORAL | Status: DC

## 2014-07-24 NOTE — Progress Notes (Signed)
This encounter was created in error - please disregard.

## 2014-07-24 NOTE — Telephone Encounter (Signed)
Patient wants prepopik

## 2014-07-29 ENCOUNTER — Encounter (HOSPITAL_COMMUNITY)
Admission: RE | Disposition: A | Discharge status: Home / Self Care | Payer: Self-pay | Source: Ambulatory Visit | Attending: Internal Medicine

## 2014-07-29 ENCOUNTER — Encounter (HOSPITAL_COMMUNITY): Payer: Self-pay | Admitting: *Deleted

## 2014-07-29 ENCOUNTER — Ambulatory Visit (HOSPITAL_COMMUNITY)
Admission: RE | Admit: 2014-07-29 | Discharge: 2014-07-29 | Disposition: A | Discharge status: Home / Self Care | Payer: 59 | Source: Ambulatory Visit | Attending: Internal Medicine | Admitting: Internal Medicine

## 2014-07-29 DIAGNOSIS — K644 Residual hemorrhoidal skin tags: Secondary | ICD-10-CM | POA: Insufficient documentation

## 2014-07-29 DIAGNOSIS — G43909 Migraine, unspecified, not intractable, without status migrainosus: Secondary | ICD-10-CM | POA: Insufficient documentation

## 2014-07-29 DIAGNOSIS — Z1211 Encounter for screening for malignant neoplasm of colon: Secondary | ICD-10-CM | POA: Insufficient documentation

## 2014-07-29 DIAGNOSIS — K5730 Diverticulosis of large intestine without perforation or abscess without bleeding: Secondary | ICD-10-CM

## 2014-07-29 DIAGNOSIS — Z880 Allergy status to penicillin: Secondary | ICD-10-CM | POA: Diagnosis not present

## 2014-07-29 DIAGNOSIS — K6289 Other specified diseases of anus and rectum: Secondary | ICD-10-CM | POA: Insufficient documentation

## 2014-07-29 DIAGNOSIS — K649 Unspecified hemorrhoids: Secondary | ICD-10-CM

## 2014-07-29 DIAGNOSIS — K6389 Other specified diseases of intestine: Secondary | ICD-10-CM

## 2014-07-29 HISTORY — PX: COLONOSCOPY: SHX5424

## 2014-07-29 HISTORY — DX: Personal history of urinary calculi: Z87.442

## 2014-07-29 HISTORY — DX: Ventricular premature depolarization: I49.3

## 2014-07-29 HISTORY — DX: Migraine, unspecified, not intractable, without status migrainosus: G43.909

## 2014-07-29 SURGERY — COLONOSCOPY
Anesthesia: Moderate Sedation

## 2014-07-29 MED ORDER — STERILE WATER FOR IRRIGATION IR SOLN
Status: DC | PRN
  Administered 2014-07-29: 08:00:00

## 2014-07-29 MED ORDER — MEPERIDINE HCL 50 MG/ML IJ SOLN
INTRAMUSCULAR | Status: AC
  Filled 2014-07-29: qty 1

## 2014-07-29 MED ORDER — MEPERIDINE HCL 50 MG/ML IJ SOLN
INTRAMUSCULAR | Status: DC | PRN
  Administered 2014-07-29 (×2): 25 mg via INTRAVENOUS

## 2014-07-29 MED ORDER — MIDAZOLAM HCL 5 MG/5ML IJ SOLN
INTRAMUSCULAR | Status: DC | PRN
  Administered 2014-07-29 (×4): 1 mg via INTRAVENOUS
  Administered 2014-07-29: 2 mg via INTRAVENOUS

## 2014-07-29 MED ORDER — LIDOCAINE HCL 2 % EX GEL
CUTANEOUS | Status: AC
  Filled 2014-07-29: qty 30

## 2014-07-29 MED ORDER — MIDAZOLAM HCL 5 MG/5ML IJ SOLN
INTRAMUSCULAR | Status: AC
  Filled 2014-07-29: qty 10

## 2014-07-29 MED ORDER — SODIUM CHLORIDE 0.9 % IV SOLN
INTRAVENOUS | Status: DC
  Administered 2014-07-29: 07:00:00 via INTRAVENOUS

## 2014-07-29 NOTE — Discharge Instructions (Signed)
Resume usual medications and high fiber diet. °No driving for 24 hours. °Next screening exam in 10 years. ° °Colonoscopy, Care After °These instructions give you information on caring for yourself after your procedure. Your doctor may also give you more specific instructions. Call your doctor if you have any problems or questions after your procedure. °HOME CARE °· Do not drive for 24 hours. °· Do not sign important papers or use machinery for 24 hours. °· You may shower. °· You may go back to your usual activities, but go slower for the first 24 hours. °· Take rest breaks often during the first 24 hours. °· Walk around or use warm packs on your belly (abdomen) if you have belly cramping or gas. °· Drink enough fluids to keep your pee (urine) clear or pale yellow. °· Resume your normal diet. Avoid heavy or fried foods. °· Avoid drinking alcohol for 24 hours or as told by your doctor. °· Only take medicines as told by your doctor. °If a tissue sample (biopsy) was taken during the procedure:  °· Do not take aspirin or blood thinners for 7 days, or as told by your doctor. °· Do not drink alcohol for 7 days, or as told by your doctor. °· Eat soft foods for the first 24 hours. °GET HELP IF: °You still have a small amount of blood in your poop (stool) 2-3 days after the procedure. °GET HELP RIGHT AWAY IF: °· You have more than a small amount of blood in your poop. °· You see clumps of tissue (blood clots) in your poop. °· Your belly is puffy (swollen). °· You feel sick to your stomach (nauseous) or throw up (vomit). °· You have a fever. °· You have belly pain that gets worse and medicine does not help. °MAKE SURE YOU: °· Understand these instructions. °· Will watch your condition. °· Will get help right away if you are not doing well or get worse. °Document Released: 09/18/2010 Document Revised: 08/21/2013 Document Reviewed: 04/23/2013 °ExitCare® Patient Information ©2015 [employer]. This information is not intended to  replace advice given to you by your health care provider. Make sure you discuss any questions you have with your health care provider. ° °

## 2014-07-29 NOTE — Op Note (Signed)
COLONOSCOPY PROCEDURE REPORT  PATIENT:  Loretta BonnetLisa B Carpenter  MR#:  [id number] Birthdate:  Jun 27, 1961, 53 y.o., female Endoscopist:  Dr. Malissa HippoNajeeb U. Rehman, MD Referred By:  Dr. Vilinda BlanksWilliam S. Gerda DissLuking, MD  Procedure Date: 07/29/2014  Procedure:   Colonoscopy  Indications:  Patient is 53 year old Caucasian female who is undergoing average risk screening colonoscopy.  Informed Consent:  The procedure and risks were reviewed with the patient and informed consent was obtained.  Medications:  Demerol 50 mg IV Versed 6 mg IV  Description of procedure:  After a digital rectal exam was performed, that colonoscope was advanced from the anus through the rectum and colon to the area of the cecum, ileocecal valve and appendiceal orifice. The cecum was deeply intubated. These structures were well-seen and photographed for the record. From the level of the cecum and ileocecal valve, the scope was slowly and cautiously withdrawn. The mucosal surfaces were carefully surveyed utilizing scope tip to flexion to facilitate fold flattening as needed. The scope was pulled down into the rectum where a thorough exam including retroflexion was performed. Terminal ileum was also examined.  Findings:   Prep excellent. Normal mucosa of terminal ileum. Few small scattered diverticula at sigmoid colon. Normal rectal mucosa. Anal papillae and hemorrhoids below the dentate line.   Therapeutic/Diagnostic Maneuvers Performed:  None  Complications:  None  Cecal Withdrawal Time:  11 minutes  Impression:  Normal mucosa of terminal ileum. Mild sigmoid colon diverticulosis. External hemorrhoids and anal papillae. No evidence of colonic polyps.  Recommendations:  Standard instructions given. High fiber diet. Next screening exam in 10 years.  REHMAN,NAJEEB U  07/29/2014 8:21 AM  CC: Dr. Harlow AsaLUKING,W S, MD & Dr. Bonnetta BarryNo ref. provider found

## 2014-07-29 NOTE — H&P (Signed)
Loretta Carpenter is an 53 y.o. female.   Chief Complaint: Patient is here for colonoscopy. HPI: Patient is 53 year old Caucasian female who is here for screening colonoscopy. Has history of hemorrhoids not had any problems associated with these. She denies abdominal pain change in bowel habits or rectal bleeding. She has good appetite her weight has been stable. Family history is negative for CRC.  Past Medical History  Diagnosis Date  . H/O renal calculi   . Migraine   . Frequent PVCs     Past Surgical History  Procedure Laterality Date  . Cesarean section      x3  . Salpingoophorectomy Left   . Lithotripsy      History reviewed. No pertinent family history. Social History:  reports that she has never smoked. She does not have any smokeless tobacco history on file. She reports that she does not drink alcohol or use illicit drugs.  Allergies:  Allergies  Allergen Reactions  . Penicillins Hives    Medications Prior to Admission  Medication Sig Dispense Refill  . fish oil-omega-3 fatty acids 1000 MG capsule Take 2 g by mouth daily.    . Sod Picosulfate-Mag Ox-Cit Acd ([place]) 10-3.5-12 MG-GM-GM PACK Take 1 kit by mouth once. 1 each 0  . UNABLE TO FIND Tumeric    . Vitamin D, Ergocalciferol, (DRISDOL) 50000 UNITS CAPS capsule Take 1 capsule (50,000 Units total) by mouth every 7 (seven) days. 4 capsule 1  . ALPRAZolam (XANAX) 0.25 MG tablet 1-2 PO Q4-6H PN ANXIETY 10 tablet 0  . cyclobenzaprine (FLEXERIL) 10 MG tablet Take 1 tablet (10 mg total) by mouth 3 (three) times daily as needed for muscle spasms. 30 tablet 0  . meloxicam (MOBIC) 15 MG tablet Take 1 tablet (15 mg total) by mouth daily. 14 tablet 0  . ondansetron (ZOFRAN) 4 MG tablet Take 1 tablet (4 mg total) by mouth every 6 (six) hours as needed. 20 tablet 0  . oxyCODONE-acetaminophen (PERCOCET/ROXICET) 5-325 MG per tablet Take 1 tablet by mouth every 4 (four) hours as needed. 15 tablet 0  . promethazine (PHENERGAN) 25 MG  suppository Place 1 suppository (25 mg total) rectally every 6 (six) hours as needed for nausea or vomiting. 12 each 0  . scopolamine (TRANSDERM-SCOP) 1 MG/3DAYS Place 1 patch (1.5 mg total) onto the skin every 3 (three) days. 6 patch 0    No results found for this or any previous visit (from the past 48 hour(s)). No results found.  ROS  Blood pressure 125/87, pulse 73, temperature 97.6 F (36.4 C), temperature source Oral, resp. rate 13, SpO2 100 %. Physical Exam  Constitutional: She appears well-developed and well-nourished.  HENT:  Mouth/Throat: Oropharynx is clear and moist.  Eyes: Conjunctivae are normal. No scleral icterus.  Neck: No thyromegaly present.  Cardiovascular: Normal rate, regular rhythm and normal heart sounds.   No murmur heard. Respiratory: Effort normal and breath sounds normal.  GI: Soft. She exhibits no distension and no mass. There is no tenderness.  Musculoskeletal: She exhibits no edema.  Lymphadenopathy:    She has no cervical adenopathy.  Neurological: She is alert.  Skin: Skin is warm and dry.     Assessment/Plan Average risk screening colonoscopy.  Loretta Carpenter U 07/29/2014, 7:38 AM

## 2014-07-30 ENCOUNTER — Encounter (HOSPITAL_COMMUNITY): Payer: Self-pay | Admitting: Internal Medicine

## 2014-08-09 ENCOUNTER — Encounter: Payer: Self-pay | Admitting: Nurse Practitioner

## 2014-08-09 ENCOUNTER — Ambulatory Visit (INDEPENDENT_AMBULATORY_CARE_PROVIDER_SITE_OTHER): Payer: 59 | Admitting: Nurse Practitioner

## 2014-08-09 VITALS — BP 122/78 | Ht 63.0 in | Wt 150.0 lb

## 2014-08-09 DIAGNOSIS — Z0000 Encounter for general adult medical examination without abnormal findings: Secondary | ICD-10-CM

## 2014-08-09 DIAGNOSIS — M19042 Primary osteoarthritis, left hand: Secondary | ICD-10-CM

## 2014-08-09 DIAGNOSIS — M19041 Primary osteoarthritis, right hand: Secondary | ICD-10-CM

## 2014-08-09 DIAGNOSIS — Z01419 Encounter for gynecological examination (general) (routine) without abnormal findings: Secondary | ICD-10-CM

## 2014-08-09 DIAGNOSIS — E559 Vitamin D deficiency, unspecified: Secondary | ICD-10-CM

## 2014-08-09 MED ORDER — VITAMIN D (ERGOCALCIFEROL) 1.25 MG (50000 UNIT) PO CAPS: 50000.0000 [IU] | ORAL_CAPSULE | ORAL | Status: DC

## 2014-08-09 MED ORDER — CIPROFLOXACIN HCL 500 MG PO TABS: 500.0000 mg | ORAL_TABLET | Freq: Two times a day (BID) | ORAL | Status: DC

## 2014-08-09 NOTE — Patient Instructions (Signed)
After Rx vitamin D take 2000 units per day OTC For arthritis, consider parafin wax treatment and Glucosamine

## 2014-08-10 ENCOUNTER — Encounter: Payer: Self-pay | Admitting: Nurse Practitioner

## 2014-08-10 DIAGNOSIS — M19042 Primary osteoarthritis, left hand: Secondary | ICD-10-CM | POA: Insufficient documentation

## 2014-08-10 DIAGNOSIS — M19041 Primary osteoarthritis, right hand: Secondary | ICD-10-CM | POA: Insufficient documentation

## 2014-08-10 NOTE — Progress Notes (Signed)
   Subjective:    Patient ID: Loretta Carpenter, female    DOB: [birth year], 53 y.o.   MRN: [id number]  HPI presents for her wellness exam. Married, same sexual partner. Regular vision and dental exams. Plans to schedule her mammogram after first of the year so [facility] Just had colonoscopy. Requesting refills on Cipro to have on hand for UTIs. Eating healthy. Regular activity. No vaginal bleeding or pelvic pain.    Review of Systems  Constitutional: Negative for fever, activity change, appetite change and fatigue.  HENT: Negative for dental problem, ear pain, sinus pressure and sore throat.   Respiratory: Negative for cough, chest tightness, shortness of breath and wheezing.   Cardiovascular: Negative for chest pain.  Gastrointestinal: Negative for nausea, vomiting, abdominal pain, diarrhea, constipation and abdominal distention.  Genitourinary: Negative for dysuria, urgency, frequency, vaginal bleeding, vaginal discharge, enuresis, difficulty urinating, genital sores and pelvic pain.  Musculoskeletal: Positive for joint swelling and arthralgias.       Arthritis fingers of both hands. Has been evaluated by specialist.       Objective:   Physical Exam  Constitutional: She is oriented to person, place, and time. She appears well-developed. No distress.  HENT:  Right Ear: External ear normal.  Left Ear: External ear normal.  Mouth/Throat: Oropharynx is clear and moist.  Neck: Normal range of motion. Neck supple. No tracheal deviation present. No thyromegaly present.  Cardiovascular: Normal rate, regular rhythm and normal heart sounds.  Exam reveals no gallop.   No murmur heard. Pulmonary/Chest: Effort normal and breath sounds normal.  Abdominal: Soft. She exhibits no distension. There is no tenderness.  Genitourinary: Vagina normal and uterus normal. No vaginal discharge found.  External GU: no lesions or rash. Vagina: no discharge. No CMT. Bimanual: no tenderness or obvious  masses.  Musculoskeletal: She exhibits no edema.  Significant nodularity in joints of fingers.  Lymphadenopathy:    She has no cervical adenopathy.  Neurological: She is alert and oriented to person, place, and time.  Skin: Skin is warm and dry. No rash noted.  Psychiatric: She has a normal mood and affect. Her behavior is normal.  Vitals reviewed. Breast exam: no masses; axillae no adenopathy.        Assessment & Plan:  Well woman exam - Plan: Lipid panel, Hepatic function panel, Basic metabolic panel  Vitamin D deficiency - Plan: Vit D  25 hydroxy (rtn osteoporosis monitoring)  Primary osteoarthritis of both hands  Meds ordered this encounter  Medications  . ciprofloxacin (CIPRO) 500 MG tablet    Sig: Take 1 tablet (500 mg total) by mouth 2 (two) times daily.    Dispense:  14 tablet    Refill:  1    Order Specific Question:  Supervising Provider    Answer:  Merlyn AlbertLUKING, WILLIAM S [[id number]]  . Vitamin D, Ergocalciferol, (DRISDOL) 50000 UNITS CAPS capsule    Sig: Take 1 capsule (50,000 Units total) by mouth every 7 (seven) days.    Dispense:  4 capsule    Refill:  1    Order Specific Question:  Supervising Provider    Answer:  Merlyn AlbertLUKING, WILLIAM S [[id number]]   Recommend daily calcium supplementation. Also discussed treatments to help arthritis pain.  Return in about 1 year (around 08/10/2015).

## 2014-11-28 ENCOUNTER — Telehealth: Payer: Self-pay | Admitting: Nurse Practitioner

## 2014-11-28 ENCOUNTER — Telehealth (INDEPENDENT_AMBULATORY_CARE_PROVIDER_SITE_OTHER): Payer: Self-pay | Admitting: Internal Medicine

## 2014-11-28 NOTE — Telephone Encounter (Signed)
error 

## 2014-11-28 NOTE — Telephone Encounter (Signed)
Pt is wanting to know if she can get a refill of the 4 pills on her vitamin d. Pt didn't  Realize that she was suppose to do the 4 pills for two months so she only did it for one month. She is wanting to do the eight weeks then start the otc. Pt said it makes her feel a lot better.   [facility]

## 2014-11-29 ENCOUNTER — Other Ambulatory Visit: Payer: Self-pay | Admitting: Nurse Practitioner

## 2014-11-29 MED ORDER — VITAMIN D (ERGOCALCIFEROL) 1.25 MG (50000 UNIT) PO CAPS: 50000.0000 [IU] | ORAL_CAPSULE | ORAL | Status: DC

## 2015-05-02 ENCOUNTER — Other Ambulatory Visit (INDEPENDENT_AMBULATORY_CARE_PROVIDER_SITE_OTHER): Payer: Self-pay | Admitting: Internal Medicine

## 2015-05-02 DIAGNOSIS — J322 Chronic ethmoidal sinusitis: Secondary | ICD-10-CM

## 2015-05-02 MED ORDER — AZITHROMYCIN 250 MG PO TABS: ORAL_TABLET | ORAL | Status: DC

## 2015-07-23 ENCOUNTER — Telehealth (INDEPENDENT_AMBULATORY_CARE_PROVIDER_SITE_OTHER): Payer: Self-pay | Admitting: Internal Medicine

## 2015-07-23 DIAGNOSIS — J322 Chronic ethmoidal sinusitis: Secondary | ICD-10-CM

## 2015-07-23 MED ORDER — AZITHROMYCIN 250 MG PO TABS: ORAL_TABLET | ORAL | Status: DC

## 2015-07-23 NOTE — Telephone Encounter (Signed)
Rx sent to her pharmacy 

## 2015-08-07 ENCOUNTER — Other Ambulatory Visit: Payer: Self-pay | Admitting: Family Medicine

## 2015-08-07 ENCOUNTER — Ambulatory Visit (HOSPITAL_COMMUNITY)
Admission: RE | Admit: 2015-08-07 | Discharge: 2015-08-07 | Disposition: A | Discharge status: Home / Self Care | Payer: 59 | Source: Ambulatory Visit | Attending: Family Medicine | Admitting: Family Medicine

## 2015-08-07 DIAGNOSIS — Z1231 Encounter for screening mammogram for malignant neoplasm of breast: Secondary | ICD-10-CM | POA: Insufficient documentation

## 2016-01-30 ENCOUNTER — Telehealth: Payer: Self-pay | Admitting: Family Medicine

## 2016-01-30 ENCOUNTER — Other Ambulatory Visit: Payer: Self-pay | Admitting: Nurse Practitioner

## 2016-01-30 MED ORDER — CIPROFLOXACIN HCL 500 MG PO TABS: 500.0000 mg | ORAL_TABLET | Freq: Two times a day (BID) | ORAL | Status: DC

## 2016-01-30 NOTE — Telephone Encounter (Signed)
Will refill x 1. We have not seen her since 2015 so recommend she come in before end of the year for her physical.

## 2016-01-30 NOTE — Telephone Encounter (Signed)
[facility] 01/30/16

## 2016-01-30 NOTE — Telephone Encounter (Signed)
Pt is requesting a refill on her cipro. Pt states that she has recurrent uti's and was told by Hoyle Sauer that if she tested positive with her home test kit to call and she would call it in.       [place]

## 2016-02-02 NOTE — Telephone Encounter (Signed)
Mailbox full

## 2016-08-02 ENCOUNTER — Telehealth: Payer: 59 | Admitting: Family

## 2016-08-02 DIAGNOSIS — J069 Acute upper respiratory infection, unspecified: Secondary | ICD-10-CM

## 2016-08-02 DIAGNOSIS — R05 Cough: Secondary | ICD-10-CM

## 2016-08-02 DIAGNOSIS — R059 Cough, unspecified: Secondary | ICD-10-CM

## 2016-08-02 MED ORDER — BENZONATATE 100 MG PO CAPS: 100.0000 mg | ORAL_CAPSULE | Freq: Two times a day (BID) | ORAL | 0 refills | Status: DC | PRN

## 2016-08-02 NOTE — Progress Notes (Signed)
We are sorry that you are not feeling well.  Here is how we plan to help!  Based on what you have shared with me it looks like you have upper respiratory tract inflammation that has resulted in a significant cough.  Inflammation and infection in the upper respiratory tract is commonly called bronchitis and has four common causes:  Allergies, Viral Infections, Acid Reflux and Bacterial Infections.  Allergies, viruses and acid reflux are treated by controlling symptoms or eliminating the cause. An example might be a cough caused by taking certain blood pressure medications. You stop the cough by changing the medication. Another example might be a cough caused by acid reflux. Controlling the reflux helps control the cough.  Based on your presentation I believe you most likely have an upper respiratory infection.     In addition you may use A prescription cough medication called Tessalon Perles 100mg . You may take 1-2 capsules every 8 hours as needed for your cough.   USE OF BRONCHODILATOR ("RESCUE") INHALERS: There is a risk from using your bronchodilator too frequently.  The risk is that over-reliance on a medication which only relaxes the muscles surrounding the breathing tubes can reduce the effectiveness of medications prescribed to reduce swelling and congestion of the tubes themselves.  Although you feel brief relief from the bronchodilator inhaler, your asthma may actually be worsening with the tubes becoming more swollen and filled with mucus.  This can delay other crucial treatments, such as oral steroid medications. If you need to use a bronchodilator inhaler daily, several times per day, you should discuss this with your provider.  There are probably better treatments that could be used to keep your asthma under control.     HOME CARE . Only take medications as instructed by your medical team. . Complete the entire course of an antibiotic. . Drink plenty of fluids and get plenty of  rest. . Avoid close contacts especially the very young and the elderly . Cover your mouth if you cough or cough into your sleeve. . Always remember to wash your hands . A steam or ultrasonic humidifier can help congestion.   GET HELP RIGHT AWAY IF: . You develop worsening fever. . You become short of breath . You cough up blood. . Your symptoms persist after you have completed your treatment plan MAKE SURE YOU   Understand these instructions.  Will watch your condition.  Will get help right away if you are not doing well or get worse.  Your e-visit answers were reviewed by a board certified advanced clinical practitioner to complete your personal care plan.  Depending on the condition, your plan could have included both over the counter or prescription medications. If there is a problem please reply  once you have received a response from your provider. Your safety is important to us.  If you have drug allergies check your prescription carefully.    You can use MyChart to ask questions about today's visit, request a non-urgent call back, or ask for a work or school excuse for 24 hours related to this e-Visit. If it has been greater than 24 hours you will need to follow up with your provider, or enter a new e-Visit to address those concerns. You will get an e-mail in the next two days asking about your experience.  I hope that your e-visit has been valuable and will speed your recovery. Thank you for using e-visits.

## 2016-12-07 ENCOUNTER — Telehealth: Payer: Self-pay | Admitting: Gastroenterology

## 2016-12-07 ENCOUNTER — Other Ambulatory Visit (HOSPITAL_COMMUNITY)
Admission: RE | Admit: 2016-12-07 | Discharge: 2016-12-07 | Disposition: A | Discharge status: Home / Self Care | Payer: 59 | Source: Ambulatory Visit | Attending: Gastroenterology | Admitting: Gastroenterology

## 2016-12-07 DIAGNOSIS — R197 Diarrhea, unspecified: Secondary | ICD-10-CM | POA: Diagnosis not present

## 2016-12-07 LAB — C DIFFICILE QUICK SCREEN W PCR REFLEX
C DIFFICILE (CDIFF) INTERP: NOT DETECTED
C DIFFICILE (CDIFF) TOXIN: NEGATIVE
C Diff antigen: NEGATIVE

## 2016-12-07 NOTE — Telephone Encounter (Signed)
PT HAVING WATERY DIARRHEA SINCE LAST FRI. GOING 6-8 TIMES A DAY. GETTING WORSE ESPECIALLY AFTER EATING. NEEDS C DIFF PCR. NO WELL WATER. SICK GRANDKID. DAUGHTER IN HOSPITAL. WORKS IN HEALTH CARE.  FAX ORDER TO [facility].

## 2017-04-01 ENCOUNTER — Telehealth: Payer: Self-pay | Admitting: Gastroenterology

## 2017-04-01 MED ORDER — DIETHYLPROPION HCL 25 MG PO TABS: ORAL_TABLET | ORAL | 0 refills | Status: DC

## 2017-04-01 NOTE — Telephone Encounter (Signed)
PT DESIRES WEIGHT LOSS. WEIGHT: 153.6 LBS HEIGHT: 5FT 5.5 IN. PT SEEN AND EXAMINED-BP 126/90. RX FOR TENUATE GIVEN. GOAL WEIGHT LOSS: 20 LBS. GOAL: 5 LBS PER MO.  Tenuate side effects Get emergency medical help if you have any of these signs of an allergic reaction to Tenuate: hives; difficult breathing; swelling of your face, lips, tongue, or throat.  Stop using Tenuate and call your doctor at once if you have a serious side effect such as: fast, pounding, or uneven heartbeats; chest pain, feeling short of breath (even with mild exertion); feeling like you might pass out; swelling in your ankles or feet; confusion, hallucinations, unusual thoughts or behavior; seizure (convulsions); muscle movements you cannot control; or sudden numbness or weakness, especially on one side of the body. Less serious Tenuate side effects may include: nausea, vomiting, diarrhea, upset stomach; headache, blurred vision; feeling nervous, anxious, or jittery; sleep problems (insomnia); dizziness, drowsiness, tired feeling; depressed mood; dry mouth, unpleasant taste in your mouth; decreased sex drive; or mild itching or rash.

## 2017-06-16 ENCOUNTER — Telehealth: Payer: Self-pay | Admitting: Gastroenterology

## 2017-06-16 DIAGNOSIS — J322 Chronic ethmoidal sinusitis: Secondary | ICD-10-CM

## 2017-06-16 MED ORDER — AZITHROMYCIN 250 MG PO TABS: ORAL_TABLET | ORAL | 1 refills | Status: DC

## 2017-06-16 MED ORDER — CIPROFLOXACIN-DEXAMETHASONE 0.3-0.1 % OT SUSP: 4.0000 [drp] | Freq: Two times a day (BID) | OTIC | 0 refills | Status: DC

## 2017-06-16 NOTE — Telephone Encounter (Signed)
SYMPTOMS FOR 7 DAYS. STARTED WITH SORE THROAT, SPITTING UP YELLOW SECRETIONS AND NOW GREEN. HAS POSTERIOR NASAL DRAINAGE FOR 2 WEEKS. STILL HAS TONSILS.TEMP AS HIGH AS 99.58F. NOW R EAR PRESSURE SEVERE AND INCREASES WITH PRESSURE. NO FACE OR TOOTH PAIN. THIS HAPPENS EVERY YEAR.  PE: LUNGS: CLEAR HEENT: THROAT-ERYTHEMA AND EXUDATE, PUPILS EQUAL.  RX ZPAK AND CIPRODEX

## 2017-06-27 ENCOUNTER — Telehealth: Payer: Self-pay | Admitting: Nurse Practitioner

## 2017-06-27 ENCOUNTER — Ambulatory Visit (INDEPENDENT_AMBULATORY_CARE_PROVIDER_SITE_OTHER): Payer: 59 | Admitting: Family Medicine

## 2017-06-27 ENCOUNTER — Encounter: Payer: Self-pay | Admitting: Family Medicine

## 2017-06-27 VITALS — BP 110/70 | Temp 97.6°F | Ht 63.0 in | Wt 142.0 lb

## 2017-06-27 DIAGNOSIS — H6501 Acute serous otitis media, right ear: Secondary | ICD-10-CM

## 2017-06-27 DIAGNOSIS — Z13220 Encounter for screening for lipoid disorders: Secondary | ICD-10-CM

## 2017-06-27 DIAGNOSIS — E559 Vitamin D deficiency, unspecified: Secondary | ICD-10-CM

## 2017-06-27 DIAGNOSIS — Z79899 Other long term (current) drug therapy: Secondary | ICD-10-CM

## 2017-06-27 DIAGNOSIS — R5383 Other fatigue: Secondary | ICD-10-CM

## 2017-06-27 MED ORDER — VITAMIN D (ERGOCALCIFEROL) 1.25 MG (50000 UNIT) PO CAPS: 50000.0000 [IU] | ORAL_CAPSULE | ORAL | 0 refills | Status: DC

## 2017-06-27 MED ORDER — DOXYCYCLINE HYCLATE 100 MG PO TABS: 100.0000 mg | ORAL_TABLET | Freq: Two times a day (BID) | ORAL | 0 refills | Status: DC

## 2017-06-27 MED ORDER — FLUCONAZOLE 150 MG PO TABS: ORAL_TABLET | ORAL | 0 refills | Status: DC

## 2017-06-27 MED ORDER — BENZONATATE 100 MG PO CAPS: ORAL_CAPSULE | ORAL | 0 refills | Status: DC

## 2017-06-27 NOTE — Telephone Encounter (Signed)
Patient has an appointment on 08/29/17 with [name].  She is requesting orders for labs.

## 2017-06-27 NOTE — Telephone Encounter (Signed)
Last labs ordered 07/2014 but not done. Lipid, liver, bmp, vit d

## 2017-06-27 NOTE — Progress Notes (Signed)
   Subjective:    Patient ID: Loretta Carpenter, female    DOB: 1960/12/04, 56 y.o.   MRN: [id number]  HPI  Patient here today for right ear pain, It feels stopped up. Weird sensation runs from the right ear down her neck. She hears a roaring sound constantly.Has had on going symptoms for three weeks now.She had originally had a respiratory  Infection with sore throat four weeks ago. She has tried flonase,zpack given by Dr.Fields through an evisit,    Pt had flus shot, symto post plus grandkid exposure ciprodex drops and nothing helps.Says she sees a sore on her tonsil. Review of Systems No headache, no major weight loss or weight gain, no chest pain no back pain abdominal pain no change in bowel habits complete ROS otherwise negative     Objective:   Physical Exam Alert, act, good hydr  Pos right tm effusion/retration  Mild nasal cong  Pharynx ok no sig symphadienitis  Lungs clear heart rrr       Assessment & Plan:  Imp peesist Right OM/effusion  Abx rx'ed, slow resolution expected, sympto care disc

## 2017-06-27 NOTE — Telephone Encounter (Signed)
Lip liv m7 cbc vit D

## 2017-06-28 NOTE — Telephone Encounter (Signed)
Orders put in. Pt notified.  

## 2017-08-27 DIAGNOSIS — Z79899 Other long term (current) drug therapy: Secondary | ICD-10-CM | POA: Diagnosis not present

## 2017-08-27 DIAGNOSIS — R5383 Other fatigue: Secondary | ICD-10-CM | POA: Diagnosis not present

## 2017-08-27 DIAGNOSIS — E559 Vitamin D deficiency, unspecified: Secondary | ICD-10-CM | POA: Diagnosis not present

## 2017-08-27 DIAGNOSIS — Z13220 Encounter for screening for lipoid disorders: Secondary | ICD-10-CM | POA: Diagnosis not present

## 2017-08-29 ENCOUNTER — Encounter: Payer: 59 | Admitting: Nurse Practitioner

## 2017-08-29 LAB — BASIC METABOLIC PANEL
BUN / CREAT RATIO: 19 (ref 9–23)
BUN: 13 mg/dL (ref 6–24)
CHLORIDE: 100 mmol/L (ref 96–106)
CO2: 23 mmol/L (ref 20–29)
Calcium: 10 mg/dL (ref 8.7–10.2)
Creatinine, Ser: 0.7 mg/dL (ref 0.57–1.00)
GFR calc non Af Amer: 99 mL/min/{1.73_m2} (ref >59)
GFR, EST AFRICAN AMERICAN: 114 mL/min/{1.73_m2} (ref >59)
GLUCOSE: 81 mg/dL (ref 65–99)
Potassium: 4.3 mmol/L (ref 3.5–5.2)
SODIUM: 137 mmol/L (ref 134–144)

## 2017-08-29 LAB — CBC WITH DIFFERENTIAL/PLATELET
BASOS ABS: 0 10*3/uL (ref 0.0–0.2)
Basos: 1 %
EOS (ABSOLUTE): 0.3 10*3/uL (ref 0.0–0.4)
Eos: 5 %
Hematocrit: 45.9 % (ref 34.0–46.6)
Hemoglobin: 14.8 g/dL (ref 11.1–15.9)
Immature Grans (Abs): 0 10*3/uL (ref 0.0–0.1)
Immature Granulocytes: 0 %
LYMPHS ABS: 1.6 10*3/uL (ref 0.7–3.1)
Lymphs: 28 %
MCH: 29 pg (ref 26.6–33.0)
MCHC: 32.2 g/dL (ref 31.5–35.7)
MCV: 90 fL (ref 79–97)
Monocytes Absolute: 0.3 10*3/uL (ref 0.1–0.9)
Monocytes: 6 %
NEUTROS ABS: 3.5 10*3/uL (ref 1.4–7.0)
Neutrophils: 60 %
Platelets: 224 10*3/uL (ref 150–379)
RBC: 5.11 x10E6/uL (ref 3.77–5.28)
RDW: 13.9 % (ref 12.3–15.4)
WBC: 5.7 10*3/uL (ref 3.4–10.8)

## 2017-08-29 LAB — HEPATIC FUNCTION PANEL
ALK PHOS: 66 IU/L (ref 39–117)
ALT: 18 IU/L (ref 0–32)
AST: 19 IU/L (ref 0–40)
Albumin: 4.4 g/dL (ref 3.5–5.5)
BILIRUBIN, DIRECT: 0.27 mg/dL (ref 0.00–0.40)
Bilirubin Total: 1.4 mg/dL — ABNORMAL HIGH (ref 0.0–1.2)
Total Protein: 7.1 g/dL (ref 6.0–8.5)

## 2017-08-29 LAB — LIPID PANEL
CHOL/HDL RATIO: 3.8 ratio (ref 0.0–4.4)
CHOLESTEROL TOTAL: 222 mg/dL — AB (ref 100–199)
HDL: 58 mg/dL (ref >39)
LDL Calculated: 146 mg/dL — ABNORMAL HIGH (ref 0–99)
Triglycerides: 88 mg/dL (ref 0–149)
VLDL Cholesterol Cal: 18 mg/dL (ref 5–40)

## 2017-08-29 LAB — VITAMIN D 25 HYDROXY (VIT D DEFICIENCY, FRACTURES): Vit D, 25-Hydroxy: 22.4 ng/mL — ABNORMAL LOW (ref 30.0–100.0)

## 2017-08-31 ENCOUNTER — Ambulatory Visit (INDEPENDENT_AMBULATORY_CARE_PROVIDER_SITE_OTHER): Payer: 59 | Admitting: Nurse Practitioner

## 2017-08-31 ENCOUNTER — Encounter: Payer: Self-pay | Admitting: Nurse Practitioner

## 2017-08-31 VITALS — BP 128/82 | Ht 63.0 in | Wt 145.8 lb

## 2017-08-31 DIAGNOSIS — E2839 Other primary ovarian failure: Secondary | ICD-10-CM

## 2017-08-31 DIAGNOSIS — N9089 Other specified noninflammatory disorders of vulva and perineum: Secondary | ICD-10-CM

## 2017-08-31 DIAGNOSIS — Z1151 Encounter for screening for human papillomavirus (HPV): Secondary | ICD-10-CM

## 2017-08-31 DIAGNOSIS — E559 Vitamin D deficiency, unspecified: Secondary | ICD-10-CM

## 2017-08-31 DIAGNOSIS — Z01419 Encounter for gynecological examination (general) (routine) without abnormal findings: Secondary | ICD-10-CM | POA: Diagnosis not present

## 2017-08-31 DIAGNOSIS — Z124 Encounter for screening for malignant neoplasm of cervix: Secondary | ICD-10-CM

## 2017-08-31 DIAGNOSIS — Z0001 Encounter for general adult medical examination with abnormal findings: Secondary | ICD-10-CM

## 2017-08-31 MED ORDER — CIPROFLOXACIN HCL 500 MG PO TABS: 500.0000 mg | ORAL_TABLET | Freq: Two times a day (BID) | ORAL | 1 refills | Status: DC

## 2017-08-31 MED ORDER — ESTRADIOL 0.1 MG/GM VA CREA: TOPICAL_CREAM | VAGINAL | 0 refills | Status: DC

## 2017-08-31 MED ORDER — VITAMIN D (ERGOCALCIFEROL) 1.25 MG (50000 UNIT) PO CAPS: 50000.0000 [IU] | ORAL_CAPSULE | ORAL | 0 refills | Status: DC

## 2017-08-31 NOTE — Patient Instructions (Addendum)
Loretta Carpenter Start vit D 1000 units per day

## 2017-09-01 ENCOUNTER — Other Ambulatory Visit: Payer: Self-pay | Admitting: Family Medicine

## 2017-09-01 DIAGNOSIS — Z1231 Encounter for screening mammogram for malignant neoplasm of breast: Secondary | ICD-10-CM

## 2017-09-01 LAB — PAP IG AND HPV HIGH-RISK
HPV, HIGH-RISK: NEGATIVE
PAP SMEAR COMMENT: 0

## 2017-09-02 ENCOUNTER — Encounter: Payer: Self-pay | Admitting: Nurse Practitioner

## 2017-09-02 ENCOUNTER — Ambulatory Visit (HOSPITAL_COMMUNITY)
Admission: RE | Admit: 2017-09-02 | Discharge: 2017-09-02 | Disposition: A | Discharge status: Home / Self Care | Payer: 59 | Source: Ambulatory Visit | Attending: Family Medicine | Admitting: Family Medicine

## 2017-09-02 DIAGNOSIS — N9089 Other specified noninflammatory disorders of vulva and perineum: Secondary | ICD-10-CM | POA: Insufficient documentation

## 2017-09-02 DIAGNOSIS — E2839 Other primary ovarian failure: Secondary | ICD-10-CM | POA: Insufficient documentation

## 2017-09-02 DIAGNOSIS — Z1231 Encounter for screening mammogram for malignant neoplasm of breast: Secondary | ICD-10-CM | POA: Insufficient documentation

## 2017-09-02 NOTE — Progress Notes (Addendum)
Subjective:    Patient ID: Loretta Carpenter, female    DOB: 15-Sep-1960, 57 y.o.   MRN: [id number]  HPI presents for her wellness exam. No vaginal bleeding or pelvic pain. Same sexual partner. Regular dental and vision exams. Requesting a refill on her Cipro to have on hand in case of UTI symptoms. Takes it on a rare basis. Trying to be more active.     Review of Systems  Constitutional: Negative for activity change, appetite change and fatigue.  HENT: Negative for dental problem, ear pain, sinus pressure and sore throat.   Respiratory: Negative for cough, chest tightness, shortness of breath and wheezing.   Cardiovascular: Negative for chest pain.  Gastrointestinal: Negative for abdominal distention, abdominal pain, blood in stool, constipation, diarrhea, nausea and vomiting.  Genitourinary: Positive for dyspareunia. Negative for difficulty urinating, dysuria, enuresis, frequency, genital sores, pelvic pain, urgency, vaginal bleeding and vaginal discharge.       Tenderness mainly at the lower introitus with intercourse; Loretta resolve after a couple of days but occurs every time. Only has vaginal discomfort at times but this is not as much of a problem.    Depression screen PHQ 2/9 08/31/2017  Decreased Interest 0  Down, Depressed, Hopeless 0  PHQ - 2 Score 0       Objective:   Physical Exam  Constitutional: She is oriented to person, place, and time. She appears well-developed. No distress.  HENT:  Right Ear: External ear normal.  Left Ear: External ear normal.  Mouth/Throat: Oropharynx is clear and moist.  Neck: Normal range of motion. Neck supple. No tracheal deviation present. No thyromegaly present.  Cardiovascular: Normal rate, regular rhythm and normal heart sounds. Exam reveals no gallop.  No murmur heard. Pulmonary/Chest: Effort normal and breath sounds normal. Right breast exhibits no inverted nipple, no mass, no skin change and no tenderness. Left breast exhibits no inverted  nipple, no mass, no skin change and no tenderness. Breasts are symmetrical.  Axillae no adenopathy.   Abdominal: Soft. She exhibits no distension. There is no tenderness.  Genitourinary: Vagina normal and uterus normal. No vaginal discharge found.  Genitourinary Comments: External GU: no erythema; skin pale with signs of hypoestrogenism; no active fissures but healing noted at base of introitus; vagina: pale slightly dry; cervix normal in appearance. No CMT. Bimanual exam: no tenderness or obvious masses.   Musculoskeletal: She exhibits no edema.  Lymphadenopathy:    She has no cervical adenopathy.  Neurological: She is alert and oriented to person, place, and time.  Skin: Skin is warm and dry. No rash noted.  Psychiatric: She has a normal mood and affect. Her behavior is normal.  Vitals reviewed.  Recent Results (from the past 2160 hour(s))  Lipid panel     Status: Abnormal   Collection Time: 08/27/17  9:41 AM  Result Value Ref Range   Cholesterol, Total 222 (H) 100 - 199 mg/dL   Triglycerides 88 0 - 149 mg/dL   HDL 58 >40 mg/dL   VLDL Cholesterol Cal 18 5 - 40 mg/dL   LDL Calculated 981 (H) 0 - 99 mg/dL   Chol/HDL Ratio 3.8 0.0 - 4.4 ratio    Comment:                                   T. Chol/HDL Ratio  Men  Women                               1/2 Avg.Risk  3.4    3.3                                   Avg.Risk  5.0    4.4                                2X Avg.Risk  9.6    7.1                                3X Avg.Risk 23.4   11.0   Hepatic function panel     Status: Abnormal   Collection Time: 08/27/17  9:41 AM  Result Value Ref Range   Total Protein 7.1 6.0 - 8.5 g/dL   Albumin 4.4 3.5 - 5.5 g/dL   Bilirubin Total 1.4 (H) 0.0 - 1.2 mg/dL   Bilirubin, Direct 2.95 0.00 - 0.40 mg/dL   Alkaline Phosphatase 66 39 - 117 IU/L   AST 19 0 - 40 IU/L   ALT 18 0 - 32 IU/L  Basic metabolic panel     Status: None   Collection Time: 08/27/17   9:41 AM  Result Value Ref Range   Glucose 81 65 - 99 mg/dL   BUN 13 6 - 24 mg/dL   Creatinine, Ser 1.88 0.57 - 1.00 mg/dL   GFR calc non Af Amer 99 >59 mL/min/1.73   GFR calc Af Amer 114 >59 mL/min/1.73   BUN/Creatinine Ratio 19 9 - 23   Sodium 137 134 - 144 mmol/L   Potassium 4.3 3.5 - 5.2 mmol/L   Chloride 100 96 - 106 mmol/L   CO2 23 20 - 29 mmol/L   Calcium 10.0 8.7 - 10.2 mg/dL  CBC with Differential/Platelet     Status: None   Collection Time: 08/27/17  9:41 AM  Result Value Ref Range   WBC 5.7 3.4 - 10.8 x10E3/uL   RBC 5.11 3.77 - 5.28 x10E6/uL   Hemoglobin 14.8 11.1 - 15.9 g/dL   Hematocrit 41.6 60.6 - 46.6 %   MCV 90 79 - 97 fL   MCH 29.0 26.6 - 33.0 pg   MCHC 32.2 31.5 - 35.7 g/dL   RDW 30.1 60.1 - 09.3 %   Platelets 224 150 - 379 x10E3/uL   Neutrophils 60 Not Estab. %   Lymphs 28 Not Estab. %   Monocytes 6 Not Estab. %   Eos 5 Not Estab. %   Basos 1 Not Estab. %   Neutrophils Absolute 3.5 1.4 - 7.0 x10E3/uL   Lymphocytes Absolute 1.6 0.7 - 3.1 x10E3/uL   Monocytes Absolute 0.3 0.1 - 0.9 x10E3/uL   EOS (ABSOLUTE) 0.3 0.0 - 0.4 x10E3/uL   Basophils Absolute 0.0 0.0 - 0.2 x10E3/uL   Immature Granulocytes 0 Not Estab. %   Immature Grans (Abs) 0.0 0.0 - 0.1 x10E3/uL  VITAMIN D 25 Hydroxy (Vit-D Deficiency, Fractures)     Status: Abnormal   Collection Time: 08/27/17  9:41 AM  Result Value Ref Range   Vit D, 25-Hydroxy 22.4 (L) 30.0 - 100.0 ng/mL    Comment: Vitamin D deficiency has been defined  by the Institute of Medicine and an Endocrine Society practice guideline as a level of serum 25-OH vitamin D less than 20 ng/mL (1,2). The Endocrine Society went on to further define vitamin D insufficiency as a level between 21 and 29 ng/mL (2). 1. IOM (Institute of Medicine). 2010. Dietary reference    intakes for calcium and D. [place]: The    [employer]. 2. Holick MF, Binkley Antler, Bischoff-Ferrari HA, et al.    Evaluation, treatment, and prevention  of vitamin D    deficiency: an Endocrine Society clinical practice    guideline. JCEM. 2011 Jul; 96(7):1911-30.   Pap IG and HPV (high risk) DNA detection     Status: None   Collection Time: 08/31/17  4:26 PM  Result Value Ref Range   DIAGNOSIS: Comment     Comment: NEGATIVE FOR INTRAEPITHELIAL LESION AND MALIGNANCY.   Specimen adequacy: Comment     Comment: Satisfactory for evaluation. Endocervical and/or squamous metaplastic cells (endocervical component) are present.    Clinician Provided ICD10 Comment     Comment: Z01.419 Z12.4 Z11.51    Performed by: Comment     Comment: Patriciaann ClanJaleesa Vann, Cytotechnologist (ASCP)   PAP Smear Comment .    Note: Comment     Comment: The Pap smear is a screening test designed to aid in the detection of premalignant and malignant conditions of the uterine cervix.  It is not a diagnostic procedure and should not be used as the sole means of detecting cervical cancer.  Both false-positive and false-negative reports do occur.    Test Methodology Comment     Comment: This liquid based ThinPrep(R) pap test was screened with the use of an image guided system.    HPV, high-risk Negative Negative    Comment: This high-risk HPV test detects thirteen high-risk types (16/18/31/33/35/39/45/51/52/56/58/59/68) without differentiation.    Reviewed labs with patient.          Assessment & Plan:   Problem List Items Addressed This Visit      Other   Genital labial fissure   Hypoestrogenism    Other Visit Diagnoses    Well woman exam    -  Primary   Relevant Orders   Pap IG and HPV (high risk) DNA detection (Completed)   Screening for cervical cancer       Relevant Orders   Pap IG and HPV (high risk) DNA detection (Completed)   Screening for HPV (human papillomavirus)       Relevant Orders   Pap IG and HPV (high risk) DNA detection (Completed)     Meds ordered this encounter  Medications  . Vitamin D, Ergocalciferol, (DRISDOL) 50000 units  CAPS capsule    Sig: Take 1 capsule (50,000 Units total) by mouth every 7 (seven) days.    Dispense:  16 capsule    Refill:  0    Order Specific Question:   Supervising Provider    Answer:   Merlyn AlbertLUKING, WILLIAM S [[id number]]  . ciprofloxacin (CIPRO) 500 MG tablet    Sig: Take 1 tablet (500 mg total) by mouth 2 (two) times daily.    Dispense:  14 tablet    Refill:  1    Order Specific Question:   Supervising Provider    Answer:   Merlyn AlbertLUKING, WILLIAM S [[id number]]  . estradiol (ESTRACE) 0.1 MG/GM vaginal cream    Sig: Apply small amount externally 3 x per week as needed for irritation    Dispense:  42.5 g  Refill:  0    Order Specific Question:   Supervising Provider    Answer:   Merlyn Albert [[id number]]   Start OTC vitamin D once Rx is complete. Patient requested a 4 month supply. Given cipro to have on hand in case of UTI symptoms. Start Estrace as directed. Use extra lubricant during intercourse. Call back if persists. Reduce fat and cholesterol in diet. Increase activity. She plans to schedule her own mammogram.  Return in about 1 year (around 08/31/2018) for physical.

## 2017-09-26 MED FILL — VIT D2 1.25 MG (50,000 UNIT: 1.25 MG | 84 days supply | Qty: 12 | Fill #0

## 2018-02-11 DIAGNOSIS — H52223 Regular astigmatism, bilateral: Secondary | ICD-10-CM | POA: Diagnosis not present

## 2018-02-11 DIAGNOSIS — H524 Presbyopia: Secondary | ICD-10-CM | POA: Diagnosis not present

## 2018-02-11 DIAGNOSIS — H5213 Myopia, bilateral: Secondary | ICD-10-CM | POA: Diagnosis not present

## 2018-05-29 ENCOUNTER — Telehealth: Payer: 59 | Admitting: Family

## 2018-05-29 DIAGNOSIS — B9789 Other viral agents as the cause of diseases classified elsewhere: Secondary | ICD-10-CM

## 2018-05-29 DIAGNOSIS — J019 Acute sinusitis, unspecified: Secondary | ICD-10-CM

## 2018-05-29 MED ORDER — FLUTICASONE PROPIONATE 50 MCG/ACT NA SUSP: 2.0000 | Freq: Every day | NASAL | 0 refills | Status: DC

## 2018-05-29 NOTE — Progress Notes (Signed)

## 2018-05-30 ENCOUNTER — Encounter: Payer: Self-pay | Admitting: Family Medicine

## 2018-05-30 ENCOUNTER — Ambulatory Visit (INDEPENDENT_AMBULATORY_CARE_PROVIDER_SITE_OTHER): Payer: 59 | Admitting: Family Medicine

## 2018-05-30 VITALS — BP 122/74 | Temp 97.9°F | Ht 63.0 in | Wt 151.1 lb

## 2018-05-30 DIAGNOSIS — G510 Bell's palsy: Secondary | ICD-10-CM | POA: Diagnosis not present

## 2018-05-30 MED ORDER — DOXYCYCLINE HYCLATE 100 MG PO TABS: 100.0000 mg | ORAL_TABLET | Freq: Two times a day (BID) | ORAL | 0 refills | Status: AC

## 2018-05-30 MED ORDER — VALACYCLOVIR HCL 1 G PO TABS: ORAL_TABLET | ORAL | 0 refills | Status: DC

## 2018-05-30 NOTE — Progress Notes (Signed)
   Subjective:    Patient ID: Loretta Carpenter, female    DOB: [birth year], 57 y.o.   MRN: [id number]  HPI Patient is here today with complaints of what she thinks was a sinus infection. She states she started experiencing facial numbness on right side of face. She states she feels like she has been numbed by the dentist.   She can not rub her lips together. She has hx of bells palsy in her twenty's.   dveloped an ulcer on the tongue after the   Dental intervention  Painful ulceration    She had a cleaning at the dentist on 05/09/2018 and developed mouth ulcers/Virus.   Also on 05/18/2018 she received the flu shot, wasnnt competely over the ulcers  ,also had a tick bite 05/24/2018., had sore tend area, felt warm  Tongue never felt 00 per cent feels scalded and dim sens on bottom third, not the bavk  Pt ad substantial pain  Noted difficulty with sensation, and ability to use  Lips to spit or   Noted pain behind the eyes,,     Dim strength on right side   Eye felt irritated, looked a little pink   Took zyrtec, and felt the lipwas a litte sollenn++++++++   She has been taking tylenol and zyrtec.   Review of Systems No headache, no major weight loss or weight gain, no chest pain no back pain abdominal pain no change in bowel habits complete ROS otherwise negative     Objective:   Physical Exam  Patient is alert slightly anxious facial tone mildly diminished on the right side.,  But diminished tone diminished tone with smile and diminished tone with opening and scrunching up eyes rather subtle, sensation to touch intact.  TMs normal pharynx normal.  Oral exam normal neck supple no lymphadenopathy no thyromegaly lungs clear.  Heart regular rhythm.      Assessment & Plan:  Impression mild Bell's palsy by exam.  Slight diminished tone.  Sensation appears intact to light touch.  Sensory symptoms appear more to be sensation of diminished strength and tone on that side.  Patient  recently had a tick bite.  Also recently had ulceration of the mouth following dental procedure.  Therefore reasonable to cover with both anti-tick borne illness medication, along with a trial of antivirals.  Patient notes grittiness right eye but no vision changes likely due to diminished closing of eyelid discussed follow-up as scheduled  Greater than 50% of this 25 minute face to face visit was spent in counseling and discussion and coordination of care regarding the above diagnosis/diagnosies

## 2018-08-02 ENCOUNTER — Ambulatory Visit: Payer: 59 | Admitting: Family Medicine

## 2018-08-02 ENCOUNTER — Telehealth: Payer: Self-pay | Admitting: Family Medicine

## 2018-08-02 DIAGNOSIS — L03032 Cellulitis of left toe: Secondary | ICD-10-CM | POA: Diagnosis not present

## 2018-08-02 DIAGNOSIS — L600 Ingrowing nail: Secondary | ICD-10-CM

## 2018-08-02 MED ORDER — TRAMADOL HCL 50 MG PO TABS: 50.0000 mg | ORAL_TABLET | Freq: Three times a day (TID) | ORAL | 0 refills | Status: DC | PRN

## 2018-08-02 MED ORDER — DOXYCYCLINE HYCLATE 100 MG PO CAPS: 100.0000 mg | ORAL_CAPSULE | Freq: Two times a day (BID) | ORAL | 0 refills | Status: DC

## 2018-08-02 NOTE — Telephone Encounter (Signed)
Patient with ingrown nail that has become infected. Toe is red ans swollen and very sore. Can medication be sent in -please advise

## 2018-08-02 NOTE — Telephone Encounter (Signed)
I can work her in at the end of the day or tomorrow at [id number] Tomorrow at 1130 would be the preferred

## 2018-08-02 NOTE — Telephone Encounter (Signed)
Infected ingrown toe nail, warm to touch, blood red (L) foot big toe half way down toe. Advise.

## 2018-08-02 NOTE — Progress Notes (Signed)
Patient presents with toe pain and discomfort on her left foot great toe Present for the past several days Denies high fever chills sweats Denies any injury PMH benign has had ingrown toenail in the past On physical exam the patient does have cellulitis with ingrown toenail Discussion was held regarding what to do for this Antibiotics prescribed warm compresses Referral to podiatry Patient to follow-up if progressive troubles No sign of abscess no sign of sepsis 15 minutes was spent with patient today discussing healthcare issues which they came.  More than 50% of this visit-total duration of visit-was spent in counseling and coordination of care.  Please see diagnosis regarding the focus of this coordination and care

## 2018-08-02 NOTE — Telephone Encounter (Signed)
Patient coming by at 4 pm today to be checked per Dr Lorin PicketScott.

## 2018-08-09 ENCOUNTER — Encounter: Payer: Self-pay | Admitting: Family Medicine

## 2018-09-04 ENCOUNTER — Encounter: Payer: Self-pay | Admitting: Family Medicine

## 2018-09-29 DIAGNOSIS — L600 Ingrowing nail: Secondary | ICD-10-CM | POA: Diagnosis not present

## 2018-09-29 DIAGNOSIS — B351 Tinea unguium: Secondary | ICD-10-CM | POA: Diagnosis not present

## 2018-09-29 DIAGNOSIS — M79675 Pain in left toe(s): Secondary | ICD-10-CM | POA: Diagnosis not present

## 2018-10-05 ENCOUNTER — Telehealth: Payer: 59 | Admitting: Physician Assistant

## 2018-10-05 DIAGNOSIS — Z20828 Contact with and (suspected) exposure to other viral communicable diseases: Secondary | ICD-10-CM | POA: Diagnosis not present

## 2018-10-05 MED ORDER — OSELTAMIVIR PHOSPHATE 75 MG PO CAPS: 75.0000 mg | ORAL_CAPSULE | Freq: Every day | ORAL | 0 refills | Status: DC

## 2018-10-05 NOTE — Progress Notes (Signed)
I have spent 10 minutes in review of patients chart and e visit questionnaire, as well as decision making and responding to patient about care plan.

## 2018-10-05 NOTE — Progress Notes (Signed)
E visit for Flu like symptoms   We are sorry that you are not feeling well.  Here is how we plan to help! Based on what you have shared with me it looks like you may have been exposed to the flu.  Influenza or "the flu" is   an infection caused by a respiratory virus. The flu virus is highly contagious and persons who did not receive their yearly flu vaccination may "catch" the flu from close contact.  We have anti-viral medications to treat the viruses that cause this infection. They are not a "cure" and only shorten the course of the infection. These prescriptions are most effective when they are given within the first 2 days of "flu" symptoms. Antiviral medication are indicated if you have a high risk of complications from the flu. You should  also consider an antiviral medication if you are in close contact with someone who is at risk. These medications can help patients avoid complications from the flu  but have side effects that you should know. Possible side effects from Tamiflu or oseltamivir include nausea, vomiting, diarrhea, dizziness, headaches, eye redness, sleep problems or other respiratory symptoms. You should not take Tamiflu if you have an allergy to oseltamivir or any to the ingredients in Tamiflu.  Based upon your symptoms and potential risk factors I have sent in a preventive dose of Tamiflu to take once daily. If you develop flu like symptoms, let us know so we can adjust dosing instructions.  ANYONE WHO HAS FLU SYMPTOMS SHOULD: . Stay home. The flu is highly contagious and going out or to work exposes others! . Be sure to drink plenty of fluids. Water is fine as well as fruit juices, sodas and electrolyte beverages. You may want to stay away from caffeine or alcohol. If you are nauseated, try taking small sips of liquids. How do you know if you are getting enough fluid? Your urine should be a pale yellow or almost colorless. . Get rest. . Taking a steamy shower or using a  humidifier may help nasal congestion and ease sore throat pain. Using a saline nasal spray works much the same way. . Cough drops, hard candies and sore throat lozenges may ease your cough. . Line up a caregiver. Have someone check on you regularly.   GET HELP RIGHT AWAY IF: . You cannot keep down liquids or your medications. . You become short of breath . Your fell like you are going to pass out or loose consciousness. . Your symptoms persist after you have completed your treatment plan MAKE SURE YOU   Understand these instructions.  Will watch your condition.  Will get help right away if you are not doing well or get worse.  Your e-visit answers were reviewed by a board certified advanced clinical practitioner to complete your personal care plan.  Depending on the condition, your plan could have included both over the counter or prescription medications.  If there is a problem please reply  once you have received a response from your provider.  Your safety is important to [place].  If you have drug allergies check your prescription carefully.    You can use MyChart to ask questions about today's visit, request a non-urgent call back, or ask for a work or school excuse for 24 hours related to this e-Visit. If it has been greater than 24 hours you will need to follow up with your provider, or enter a new e-Visit to address those concerns.  You will get an e-mail in the next two days asking about your experience.  I hope that your e-visit has been valuable and will speed your recovery. Thank you for using e-visits.

## 2018-10-19 ENCOUNTER — Telehealth: Payer: Self-pay | Admitting: Family Medicine

## 2018-10-19 NOTE — Telephone Encounter (Signed)
Left message to return call 

## 2018-10-19 NOTE — Telephone Encounter (Signed)
Please advise 

## 2018-10-19 NOTE — Telephone Encounter (Signed)
Pt was given tamiflu last week after being exposed to the flu from her grandson. 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 She would like tamiflu called in again to [facility] [phone] - , [place] - [phone] AT [facility] [address]

## 2018-10-19 NOTE — Telephone Encounter (Signed)
1.  Go ahead and send in the Tamiflu 75 mg 1 twice daily- this prescription may be valid for 14 days We do not recommend taking the Tamiflu just because they were around somebody or next to somebody I would only use it if actually having the symptoms of the flu If any progressive symptoms worrisome symptoms or ongoing illness need to be seen

## 2018-10-20 ENCOUNTER — Telehealth: Payer: 59 | Admitting: Family

## 2018-10-20 DIAGNOSIS — J111 Influenza due to unidentified influenza virus with other respiratory manifestations: Secondary | ICD-10-CM | POA: Diagnosis not present

## 2018-10-20 DIAGNOSIS — R6889 Other general symptoms and signs: Secondary | ICD-10-CM | POA: Diagnosis not present

## 2018-10-20 MED ORDER — OSELTAMIVIR PHOSPHATE 75 MG PO CAPS: 75.0000 mg | ORAL_CAPSULE | Freq: Two times a day (BID) | ORAL | 0 refills | Status: DC

## 2018-10-20 NOTE — Progress Notes (Signed)

## 2018-10-20 NOTE — Telephone Encounter (Signed)
Pt contacted. Pt states she did an E Visit yesterday due to not hearing from [place] yesterday. Pt states she tried to call this morning but no answer. Informed pt that we did not open until 10 a.m. pt verbalized understandign. Pt states that she does not need [place] to send in Tamiflu since she did the E visit. Pt states that she will not take the med unless she has flu symptoms.

## 2018-10-27 DIAGNOSIS — B351 Tinea unguium: Secondary | ICD-10-CM | POA: Diagnosis not present

## 2018-10-27 DIAGNOSIS — L600 Ingrowing nail: Secondary | ICD-10-CM | POA: Diagnosis not present

## 2018-10-27 DIAGNOSIS — M79675 Pain in left toe(s): Secondary | ICD-10-CM | POA: Diagnosis not present

## 2018-12-25 ENCOUNTER — Other Ambulatory Visit: Payer: Self-pay

## 2018-12-25 ENCOUNTER — Ambulatory Visit (INDEPENDENT_AMBULATORY_CARE_PROVIDER_SITE_OTHER): Payer: 59 | Admitting: Family Medicine

## 2018-12-25 DIAGNOSIS — J329 Chronic sinusitis, unspecified: Secondary | ICD-10-CM | POA: Diagnosis not present

## 2018-12-25 DIAGNOSIS — J310 Chronic rhinitis: Secondary | ICD-10-CM

## 2018-12-25 MED ORDER — CLARITHROMYCIN 500 MG PO TABS: 500.0000 mg | ORAL_TABLET | Freq: Two times a day (BID) | ORAL | 0 refills | Status: DC

## 2018-12-25 NOTE — Progress Notes (Signed)
   Subjective:    Patient ID: Loretta Carpenter, female    DOB: 05-14-1961, 58 y.o.   MRN: [id number] Format  video plus audio  Patient present at home Provider present at office Consent for interaction obtained Coronavirus outbreak made virtual visit necessary  Sore Throat   This is a new problem. Episode onset: 3 - 4 days. There has been no fever. Associated symptoms include congestion. Associated symptoms comments: Throat scratchy and red, left side of throat looks swollen. Treatments tried: otc meds.  cheeks feel sore and teeth hurt.  Virtual Visit via Video Note  I connected with Loretta Carpenter on 12/25/18 at  3:00 PM EDT by a video enabled telemedicine application and verified that I am speaking with the correct person using two identifiers.   I discussed the limitations of evaluation and management by telemedicine and the availability of in person appointments. The patient expressed understanding and agreed to proceed.  History of Present Illness:    Observations/Objective:   Assessment and Plan:   Follow Up Instructions:    I discussed the assessment and treatment plan with the patient. The patient was provided an opportunity to ask questions and all were answered. The patient agreed with the plan and demonstrated an understanding of the instructions.   The patient was advised to call back or seek an in-person evaluation if the symptoms worsen or if the condition fails to improve as anticipated.  I provided 18 minutes of non-face-to-face time during this encounter.  Patient notes nasal congestion.  Sore throat intermittently.  Hoarseness.  Some phlegm in the throat that she is getting up.  No fever.  No deep cough.  No chest symptoms.  Spouse has similar illness.  No shortness of breath  Review of Systems  HENT: Positive for congestion.        Objective:   Physical Exam   Virtual visit     Assessment & Plan:  Probable springtime cold/allergy with secondary  rhinosinusitis plan antibiotics prescribed symptom care discussed warning signs discussed with this to progress into other symptomatology.

## 2018-12-25 NOTE — Addendum Note (Signed)
Addended by: Metro Kung on: 12/25/2018 03:32 PM   Modules accepted: Orders

## 2019-01-06 NOTE — Progress Notes (Signed)
Greater than 5 minutes, yet less than 10 minutes of time have been spent researching, coordinating, and implementing care for this patient today.  Thank you for the details you included in the comment boxes. Those details are very helpful in determining the best course of treatment for you and help us to provide the best care.  

## 2019-03-13 DIAGNOSIS — B351 Tinea unguium: Secondary | ICD-10-CM | POA: Diagnosis not present

## 2019-07-13 DIAGNOSIS — B351 Tinea unguium: Secondary | ICD-10-CM | POA: Diagnosis not present

## 2019-08-31 DIAGNOSIS — R002 Palpitations: Secondary | ICD-10-CM

## 2019-08-31 HISTORY — DX: Palpitations: R00.2

## 2019-10-03 ENCOUNTER — Encounter: Payer: Self-pay | Admitting: Family Medicine

## 2020-01-02 ENCOUNTER — Other Ambulatory Visit: Payer: Self-pay

## 2020-01-02 ENCOUNTER — Encounter: Payer: Self-pay | Admitting: Cardiovascular Disease

## 2020-01-02 ENCOUNTER — Ambulatory Visit: Payer: 59 | Admitting: Cardiovascular Disease

## 2020-01-02 VITALS — BP 122/86 | HR 77 | Ht 65.5 in | Wt 166.4 lb

## 2020-01-02 DIAGNOSIS — R002 Palpitations: Secondary | ICD-10-CM

## 2020-01-02 DIAGNOSIS — I479 Paroxysmal tachycardia, unspecified: Secondary | ICD-10-CM | POA: Diagnosis not present

## 2020-01-02 MED ORDER — METOPROLOL TARTRATE 25 MG PO TABS
ORAL_TABLET | ORAL | 3 refills | Status: DC
Start: 2020-01-02 — End: 2021-06-24

## 2020-01-02 NOTE — Progress Notes (Signed)
CARDIOLOGY CONSULT NOTE  Patient ID: Loretta Carpenter MRN: [id number] DOB/AGE: 1961-06-03 59 y.o.  Admit date: (Not on file) Primary Physician: Mikey Kirschner, MD  Reason for Consultation: Palpitations  HPI: Loretta Carpenter is a 59 y.o. female who is being seen today for the evaluation of palpitations and is self-referred.  She contacted me this morning to let me know that her heart rate was 199 bpm last night.  Her daughter-in-law who is a Marine scientist at [employer] in the cardiac stepdown unit confirmed that heart rates are ranging from 180-190 bpm.  Her watch notified her that she might be in atrial fibrillation.  She initially talked to me about having palpitations for about 5 days back in September 2020.  She has a history of PVCs/palpitations dating back to when she was 59 years old.  Last night she had eaten some tacos and then went and lied down.  She then felt like something was stuck in her throat.  She realized afterwards that she was experiencing shortness of breath.  Her heart rate was noted to be 186-189 bpm.  She then sat up and tried performing vagal maneuvers.  Her blood pressure was checked and it was 163/123.  Eventually came down to 158/90.  Her blood pressure has generally run in the 118/80 range for the past 20 years.  He showed me multiple ECG tracings on her phone with heart rates as high as 200 bpm.  Some of the rhythm strips appear to be consistent with SVT.  At higher rates there was some mild irregularity with the possibility of atrial fibrillation although this cannot be determined based on these particular types of tracings.  Symptoms began at roughly 8:35/8:40 PM and lasted until 9:25 PM.  She was then noted to be in sinus tachycardia with a heart rate of 110 bpm.  At that point she felt better.  She does not consume alcohol nor does she smoke.  She has put on about 18 pounds over the pandemic and recent began exercising for 30 minutes a day.  She does not consume  coffee or tea and has been drinking Sprite but has cut this out over the last several weeks in an attempt to lose weight.  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  ECG performed in the office today which I ordered and personally interpreted demonstrates normal sinus rhythm with no ischemic ST segment or T-wave abnormalities, nor any arrhythmias.    Social history: Her mother, Loretta Carpenter, is also my patient.  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  Allergies  Allergen Reactions  . Penicillins Hives    Current Outpatient Medications  Medication Sig Dispense Refill  . acetaminophen (TYLENOL) 500 MG tablet Take 500 mg by mouth as needed.    . Ascorbic Acid (VITAMIN C PO) Take 1 tablet by mouth daily.    [name] estradiol (ESTRACE) 0.1 MG/GM vaginal cream Apply small amount externally 3 x per week as needed for irritation 42.5 g 0  . UNABLE TO FIND Take 1 tablet by mouth daily. Tumeric     . VITAMIN D PO 5,000 IU by mouth daily    . Zinc 220 (50 Zn) MG CAPS Take 1 capsule by mouth daily.    . metoprolol tartrate (LOPRESSOR) 25 MG tablet Take 1/2 tab - 1 tab by mouth as needed for palpitations or heart rate greater than 150  bpm (sustained rate) 30 tablet 3   No current facility-administered medications for this visit.    Past Medical History:  Diagnosis Date  . Frequent PVCs   . H/O renal calculi   . Migraine     Past Surgical History:  Procedure Laterality Date  . CESAREAN SECTION     x3  . COLONOSCOPY N/A 07/29/2014   Procedure: COLONOSCOPY;  Surgeon: Malissa Hippo, MD;  Location: [facility];  Service: Endoscopy;  Laterality: N/A;  730 - moved to 11/30 @ 7:30  . LITHOTRIPSY    . SALPINGOOPHORECTOMY Left     Social History   Socioeconomic History  . Marital status: Married    Spouse name: Not on file  . Number of children: Not on file  . Years of education: Not on file  . Highest education level: Not  on file  Occupational History  . Not on file  Tobacco Use  . Smoking status: Never Smoker  . Smokeless tobacco: Never Used  Substance and Sexual Activity  . Alcohol use: No  . Drug use: No  . Sexual activity: Yes    Birth control/protection: Surgical, Post-menopausal  Other Topics Concern  . Not on file  Social History Narrative  . Not on file   Social Determinants of Health   Financial Resource Strain:   . Difficulty of Paying Living Expenses:   Food Insecurity:   . Worried About Programme researcher, broadcasting/film/video in the Last Year:   . Barista in the Last Year:   Transportation Needs:   . Freight forwarder (Medical):   [name] Lack of Transportation (Non-Medical):   Physical Activity:   . Days of Exercise per Week:   . Minutes of Exercise per Session:   Stress:   . Feeling of Stress :   Social Connections:   . Frequency of Communication with Friends and Family:   . Frequency of Social Gatherings with Friends and Family:   . Attends Religious Services:   . Active Member of Clubs or Organizations:   . Attends Banker Meetings:   [name] Marital Status:   Intimate Partner Violence:   . Fear of Current or Ex-Partner:   . Emotionally Abused:   [name] Physically Abused:   . Sexually Abused:      No family history of premature CAD in 1st degree relatives.  Current Meds  Medication Sig  . acetaminophen (TYLENOL) 500 MG tablet Take 500 mg by mouth as needed.  . Ascorbic Acid (VITAMIN C PO) Take 1 tablet by mouth daily.  [name] estradiol (ESTRACE) 0.1 MG/GM vaginal cream Apply small amount externally 3 x per week as needed for irritation  . UNABLE TO FIND Take 1 tablet by mouth daily. Tumeric   . VITAMIN D PO 5,000 IU by mouth daily  . Zinc 220 (50 Zn) MG CAPS Take 1 capsule by mouth daily.      Review of systems complete and found to be negative unless listed above in HPI   [facility], LPN was present throughout the entirety of the encounter.  Physical exam Blood  pressure 122/86, pulse 77, height 5' 5.5" (1.664 m), weight 166 lb 6.4 oz (75.5 kg), SpO2 98 %. General: NAD Neck: No JVD, no thyromegaly or thyroid nodule.  Lungs: Clear to auscultation bilaterally with normal respiratory effort. CV: Nondisplaced PMI. Regular rate and rhythm, normal S1/S2, no S3/S4, no murmur.  Trivial bilateral periankle edema.  Bilateral venous varicosities in lower extremities.  No carotid bruit.    Abdomen: Soft, nontender, no distention.  Skin: Intact without lesions or rashes.  Neurologic: Alert and oriented x 3.  Psych: Normal affect. Extremities: No clubbing or cyanosis.  HEENT: Normal.   ECG: Most recent ECG reviewed.   Labs: Lab Results  Component Value Date/Time   K 4.3 08/27/2017 09:41 AM   BUN 13 08/27/2017 09:41 AM   CREATININE 0.70 08/27/2017 09:41 AM   CREATININE 0.61 07/12/2013 04:30 AM   ALT 18 08/27/2017 09:41 AM   TSH 1.529 07/12/2013 04:30 AM   HGB 14.8 08/27/2017 09:41 AM     Lipids: Lab Results  Component Value Date/Time   LDLCALC 146 (H) 08/27/2017 09:41 AM   CHOL 222 (H) 08/27/2017 09:41 AM   TRIG 88 08/27/2017 09:41 AM   HDL 58 08/27/2017 09:41 AM        ASSESSMENT AND PLAN:   1.  Tachycardia/palpitations: Please see discussion above.  I suspect with heart rates as high as 200 bpm and with irregularity noted on her phone tracings that she was in SVT and perhaps AVNRT.  She has not had blood work in at least 2 years.  I will obtain a comprehensive metabolic panel, magnesium, TSH, and CBC. I will order a 2-D echocardiogram with Doppler to evaluate cardiac structure, function, and regional wall motion. I will also obtain a 30-day event monitor to assess for PAC burden and perhaps SVT or even atrial fibrillation.  I will also prescribe metoprolol 12.5 mg - 25 mg to be taken as needed. I explained to her that if she does have SVT one could consider medical therapy with beta-blockers or calcium channel blockers.  I also told her that  definitive treatment would be an EP study and ablation.    Disposition: Follow up virtual visit in 3 weeks.  Follow-up office visit in 2 months.  Signed: Prentice Docker, M.D., F.A.C.C.  01/02/2020, 4:39 PM

## 2020-01-02 NOTE — Patient Instructions (Addendum)
Medication Instructions:   Begin Lopressor 25mg  - 1/2 to 1 tab as needed.   Continue all other medications.    Labwork: CMET, Magnesium, TSH, CBC  Testing/Procedures:  Your physician has requested that you have an echocardiogram. Echocardiography is a painless test that uses sound waves to create images of your heart. It provides your doctor with information about the size and shape of your heart and how well your heart's chambers and valves are working. This procedure takes approximately one hour. There are no restrictions for this procedure.  Your physician has recommended that you wear a 30 day event monitor. Event monitors are medical devices that record the heart's electrical activity. Doctors most often these monitors to diagnose arrhythmias. Arrhythmias are problems with the speed or rhythm of the heartbeat. The monitor is a small, portable device. You can wear one while you do your normal daily activities. This is usually used to diagnose what is causing palpitations/syncope (passing out).  Follow-Up:  Office will contact with results via phone or letter.    End of May for VV - before May 28th  End of June or first of July in office.     Any Other Special Instructions Will Be Listed Below (If Applicable).  If you need a refill on your cardiac medications before your next appointment, please call your pharmacy.

## 2020-01-03 ENCOUNTER — Telehealth: Payer: Self-pay | Admitting: *Deleted

## 2020-01-03 ENCOUNTER — Other Ambulatory Visit (HOSPITAL_COMMUNITY)
Admission: RE | Admit: 2020-01-03 | Discharge: 2020-01-03 | Disposition: A | Discharge status: Home / Self Care | Payer: 59 | Source: Ambulatory Visit | Attending: Cardiovascular Disease | Admitting: Cardiovascular Disease

## 2020-01-03 DIAGNOSIS — I479 Paroxysmal tachycardia, unspecified: Secondary | ICD-10-CM | POA: Diagnosis not present

## 2020-01-03 LAB — COMPREHENSIVE METABOLIC PANEL
ALT: 22 U/L (ref 0–44)
AST: 20 U/L (ref 15–41)
Albumin: 4.1 g/dL (ref 3.5–5.0)
Alkaline Phosphatase: 65 U/L (ref 38–126)
Anion gap: 9 (ref 5–15)
BUN: 17 mg/dL (ref 6–20)
CO2: 24 mmol/L (ref 22–32)
Calcium: 9 mg/dL (ref 8.9–10.3)
Chloride: 105 mmol/L (ref 98–111)
Creatinine, Ser: 0.55 mg/dL (ref 0.44–1.00)
GFR calc Af Amer: >60 mL/min (ref >60)
GFR calc non Af Amer: >60 mL/min (ref >60)
Glucose, Bld: 96 mg/dL (ref 70–99)
Potassium: 4 mmol/L (ref 3.5–5.1)
Sodium: 138 mmol/L (ref 135–145)
Total Bilirubin: 1.6 mg/dL — ABNORMAL HIGH (ref 0.3–1.2)
Total Protein: 7.2 g/dL (ref 6.5–8.1)

## 2020-01-03 LAB — CBC
HCT: 45.2 % (ref 36.0–46.0)
Hemoglobin: 14.3 g/dL (ref 12.0–15.0)
MCH: 28.1 pg (ref 26.0–34.0)
MCHC: 31.6 g/dL (ref 30.0–36.0)
MCV: 88.8 fL (ref 80.0–100.0)
Platelets: 225 10*3/uL (ref 150–400)
RBC: 5.09 MIL/uL (ref 3.87–5.11)
RDW: 13.1 % (ref 11.5–15.5)
WBC: 7.4 10*3/uL (ref 4.0–10.5)
nRBC: 0 % (ref 0.0–0.2)

## 2020-01-03 LAB — TSH: TSH: 1.241 u[IU]/mL (ref 0.350–4.500)

## 2020-01-03 LAB — MAGNESIUM: Magnesium: 2.1 mg/dL (ref 1.7–2.4)

## 2020-01-03 NOTE — Telephone Encounter (Signed)
Lesle Chris, [place]  11/05/9371 4:28 PM EDT    Patient notified. Copy to pcp.

## 2020-01-03 NOTE — Telephone Encounter (Signed)
Laqueta Linden, MD  01/03/2020 9:44 AM EDT    Normal TSH   Laqueta Linden, MD  01/03/2020 9:11 AM EDT    CBC, Mg, potassium, renal function all normal. Bilirubin mildly elevated which should have been in December 2018 as well. Please forward a copy to PCP.

## 2020-01-10 ENCOUNTER — Telehealth: Payer: Self-pay | Admitting: Cardiovascular Disease

## 2020-01-10 NOTE — Telephone Encounter (Signed)
Please give pt a call-- has questions concerning monitor    [phone]

## 2020-01-14 ENCOUNTER — Ambulatory Visit (HOSPITAL_COMMUNITY)
Admission: RE | Admit: 2020-01-14 | Discharge: 2020-01-14 | Disposition: A | Discharge status: Home / Self Care | Payer: 59 | Source: Ambulatory Visit | Attending: Cardiovascular Disease | Admitting: Cardiovascular Disease

## 2020-01-14 ENCOUNTER — Other Ambulatory Visit: Payer: Self-pay

## 2020-01-14 DIAGNOSIS — I479 Paroxysmal tachycardia, unspecified: Secondary | ICD-10-CM | POA: Insufficient documentation

## 2020-01-14 DIAGNOSIS — R002 Palpitations: Secondary | ICD-10-CM | POA: Insufficient documentation

## 2020-01-14 NOTE — Telephone Encounter (Signed)
Had questions if she should wear continuous 30 day monitor.Having had echo today and has probably met her deductible, she will keep continuous monitor on.

## 2020-01-14 NOTE — Progress Notes (Signed)
*  PRELIMINARY RESULTS* Echocardiogram 2D Echocardiogram has been performed.  Loretta Carpenter 01/14/2020, 3:11 PM

## 2020-01-15 DIAGNOSIS — B351 Tinea unguium: Secondary | ICD-10-CM | POA: Diagnosis not present

## 2020-01-21 ENCOUNTER — Encounter: Payer: Self-pay | Admitting: Cardiovascular Disease

## 2020-01-21 ENCOUNTER — Telehealth (INDEPENDENT_AMBULATORY_CARE_PROVIDER_SITE_OTHER): Payer: 59 | Admitting: Cardiovascular Disease

## 2020-01-21 ENCOUNTER — Ambulatory Visit (INDEPENDENT_AMBULATORY_CARE_PROVIDER_SITE_OTHER): Payer: 59

## 2020-01-21 VITALS — BP 118/85 | HR 68 | Ht 65.0 in | Wt 159.8 lb

## 2020-01-21 DIAGNOSIS — I479 Paroxysmal tachycardia, unspecified: Secondary | ICD-10-CM | POA: Diagnosis not present

## 2020-01-21 DIAGNOSIS — R002 Palpitations: Secondary | ICD-10-CM

## 2020-01-21 DIAGNOSIS — G4733 Obstructive sleep apnea (adult) (pediatric): Secondary | ICD-10-CM

## 2020-01-21 NOTE — Patient Instructions (Signed)
Medication Instructions:  Continue all current medications.  Labwork: none  Testing/Procedures: none  Follow-Up: July or August   Any Other Special Instructions Will Be Listed Below (If Applicable). You have been referred to:  [facility]   If you need a refill on your cardiac medications before your next appointment, please call your pharmacy.

## 2020-01-21 NOTE — Addendum Note (Signed)
Addended by: Lesle Chris on: 01/21/2020 05:11 PM   Modules accepted: Orders

## 2020-01-21 NOTE — Progress Notes (Signed)
Virtual Visit via Telephone Note   This visit type was conducted due to national recommendations for restrictions regarding the COVID-19 Pandemic (e.g. social distancing) in an effort to limit this patient's exposure and mitigate transmission in our community.  Due to her co-morbid illnesses, this patient is at least at moderate risk for complications without adequate follow up.  This format is felt to be most appropriate for this patient at this time.  The patient did not have access to video technology/had technical difficulties with video requiring transitioning to audio format only (telephone).  All issues noted in this document were discussed and addressed.  No physical exam could be performed with this format.  Please refer to the patient's chart for her  consent to telehealth for [facility].   The patient was identified using 2 identifiers.  Date:  01/21/2020   ID:  Loretta Carpenter, DOB [birth year], MRN [id number]  Patient Location: Home Provider Location: Office  PCP:  Merlyn Albert, MD  Cardiologist:  Prentice Docker, MD  Electrophysiologist:  None   Evaluation Performed:  Follow-Up Visit  Chief Complaint:  Palpitations  History of Present Illness:    Loretta Carpenter is a 59 y.o. female with palpitations.  She had heart rates with tracings on her phone as high as 200 bpm which she showed me at her visit on 01/02/2020.  She has lost 7 pounds since her visit with me on 01/02/2020 by exercising for 30 minutes daily and significantly cutting back on food portions.  The highest her heart rate has been is 120 bpm but it is not sustained.  She has not had to take metoprolol.  She received the event monitor today.  She had questions about her mildly elevated pulmonary pressures and then told me that she snores frequently and has sleep apnea.  She asked me about getting a sleep study.   Social history: Her mother, Loretta Carpenter, is also my patient.  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  She has 1 living daughter.  Her 91 year old daughter died in a motor vehicle accident 17 years ago this June.  Jaskirat has a strong faith.   Past Medical History:  Diagnosis Date  . Frequent PVCs   . H/O renal calculi   . Migraine    Past Surgical History:  Procedure Laterality Date  . CESAREAN SECTION     x3  . COLONOSCOPY N/A 07/29/2014   Procedure: COLONOSCOPY;  Surgeon: Malissa Hippo, MD;  [facility];  Service: Endoscopy;  Laterality: N/A;  730 - moved to 11/30 @ 7:30  . LITHOTRIPSY    . SALPINGOOPHORECTOMY Left      Current Meds  Medication Sig  . acetaminophen (TYLENOL) 500 MG tablet Take 500 mg by mouth as needed.  . Ascorbic Acid (VITAMIN C PO) Take 1 tablet by mouth daily.  [name] estradiol (ESTRACE) 0.1 MG/GM vaginal cream Apply small amount externally 3 x per week as needed for irritation  . metoprolol tartrate (LOPRESSOR) 25 MG tablet Take 1/2 tab - 1 tab by mouth as needed for palpitations or heart rate greater than 150 bpm (sustained rate)  . UNABLE TO FIND Take 1 tablet by mouth daily. Tumeric   . VITAMIN D PO 5,000 IU by mouth daily  . Zinc 220 (50 Zn) MG CAPS Take 1 capsule by mouth daily.     Allergies:   Penicillins   Social History   Tobacco Use  .  Smoking status: Never Smoker  . Smokeless tobacco: Never Used  Substance Use Topics  . Alcohol use: No  . Drug use: No     Family Hx: The patient's family history includes Arthritis in her father and mother; Hypertension in her father.  ROS:   Please see the history of present illness.     All other systems reviewed and are negative.   Prior CV studies:   The following studies were reviewed today:  Echocardiogram 01/14/2020:  1. Left ventricular ejection fraction, by estimation, is >75%. The left  ventricle has hyperdynamic function. The left ventricle has no regional  wall motion abnormalities. There is mild left ventricular hypertrophy.  Left ventricular  diastolic parameters  are indeterminate.  2. Right ventricular systolic function is normal. The right ventricular  size is normal. There is mildly elevated pulmonary artery systolic  pressure.  3. The mitral valve is normal in structure. No evidence of mitral valve  regurgitation. No evidence of mitral stenosis.  4. The aortic valve was not well visualized. Aortic valve regurgitation  is not visualized. No aortic stenosis is present.  5. The inferior vena cava is normal in size with greater than 50%  respiratory variability, suggesting right atrial pressure of 3 mmHg.   Labs/Other Tests and Data Reviewed:    EKG:  No ECG reviewed.  Recent Labs: 01/03/2020: ALT 22; BUN 17; Creatinine, Ser 0.55; Hemoglobin 14.3; Magnesium 2.1; Platelets 225; Potassium 4.0; Sodium 138; TSH 1.241   Recent Lipid Panel Lab Results  Component Value Date/Time   CHOL 222 (H) 08/27/2017 09:41 AM   TRIG 88 08/27/2017 09:41 AM   HDL 58 08/27/2017 09:41 AM   CHOLHDL 3.8 08/27/2017 09:41 AM   CHOLHDL 4.2 07/12/2013 04:30 AM   LDLCALC 146 (H) 08/27/2017 09:41 AM    Wt Readings from Last 3 Encounters:  01/21/20 159 lb 12.8 oz (72.5 kg)  01/02/20 166 lb 6.4 oz (75.5 kg)  07/13/13 145 lb 12.8 oz (66.1 kg)     Objective:    Vital Signs:  BP 118/85   Pulse 68   Ht 5\' 5"  (1.651 m)   Wt 159 lb 12.8 oz (72.5 kg)   BMI 26.59 kg/m    VITAL SIGNS:  reviewed  ASSESSMENT & PLAN:    1.  Tachycardia/palpitations:   I suspect with heart rates as high as 200 bpm and with irregularity noted on her phone tracings that she was in SVT and perhaps AVNRT.  Comprehensive metabolic panel showed mildly elevated total bilirubin of 1.6.  CBC, magnesium, potassium, and renal function were normal. Echocardiogram demonstrated vigorous LV systolic function with mild LVH and mildly elevated pulmonary pressures. I previously prescribed metoprolol 12.5 mg to 25 mg to be taken as needed.  She has not had to take any thus far.  I  also ordered a 30-day event monitor to assess for PAC burden and perhaps SVT or even atrial fibrillation.  She received this today. I previously explained to her that if she does have SVT one could consider medical therapy with beta-blockers or calcium channel blockers.  I also told her that definitive treatment would be an EP study and ablation.  2.  Obstructive sleep apnea: I Loretta arrange for a referral to a sleep specialist and a sleep study.  This is likely the reason for mildly elevated pulmonary pressures as well as 20 pound weight gain.  I congratulated her on her 7 pound weight loss already.  She plans to lose 20  pounds altogether.    COVID-19 Education: The signs and symptoms of COVID-19 were discussed with the patient and how to seek care for testing (follow up with PCP or arrange E-visit).  The importance of social distancing was discussed today.  Time:   Today, I have spent 20 minutes with the patient with telehealth technology discussing the above problems.     Medication Adjustments/Labs and Tests Ordered: Current medicines are reviewed at length with the patient today.  Concerns regarding medicines are outlined above.   Tests Ordered: No orders of the defined types were placed in this encounter.   Medication Changes: No orders of the defined types were placed in this encounter.   Follow Up:  In Person July or August 2021  Signed, Prentice Docker, MD  01/21/2020 1:15 PM    [facility]

## 2020-01-23 ENCOUNTER — Other Ambulatory Visit: Payer: Self-pay

## 2020-02-13 ENCOUNTER — Encounter: Payer: Self-pay | Admitting: Neurology

## 2020-02-13 ENCOUNTER — Ambulatory Visit (INDEPENDENT_AMBULATORY_CARE_PROVIDER_SITE_OTHER): Payer: 59 | Admitting: Neurology

## 2020-02-13 VITALS — BP 122/82 | HR 72 | Ht 64.0 in | Wt 158.0 lb

## 2020-02-13 DIAGNOSIS — G478 Other sleep disorders: Secondary | ICD-10-CM

## 2020-02-13 DIAGNOSIS — I472 Ventricular tachycardia, unspecified: Secondary | ICD-10-CM

## 2020-02-13 DIAGNOSIS — I4729 Other ventricular tachycardia: Secondary | ICD-10-CM

## 2020-02-13 DIAGNOSIS — G4719 Other hypersomnia: Secondary | ICD-10-CM

## 2020-02-13 DIAGNOSIS — R0683 Snoring: Secondary | ICD-10-CM

## 2020-02-13 NOTE — Patient Instructions (Signed)

## 2020-02-13 NOTE — Progress Notes (Signed)
SLEEP MEDICINE CLINIC    Provider:  Melvyn Novas, MD  Primary Care Physician:  Merlyn Albert, MD [address] [place] [address]     Referring Provider: Dr Purvis Sheffield, MD [facility]/ cardiology        Chief Complaint according to patient   Patient presents with:    . New Patient (Initial Visit)     presents today per cardiologist wanting to work up OSA. pt does snore loudly and complains of fatigue, she is hypersomniac. She had tachycardia ( suspected SVT) and now wears a monitor and had Echo. Was told she had elevated pulmonary pressure. Tachycardia occurred in sleep.       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 Chief concern according to patient :  cardiologist wanting to work up OSA. pt does snore loudly and complains of fatigue, she is hypersomniac. She had nocturnal episodes of tachycardia ( suspected SVT) and now wears a cardiac monitor and had Echo. Was told she had elevated pulmonary pressure. Tachycardia occurred in sleep, once in September 2020 and another time on 01-01-2020.    I have the pleasure of seeing Loretta Carpenter today, a right -handed  Caucasian female with a possible sleep disorder.  She   has a past medical history of Frequent PVCs, had tachycardia H/O renal calculi, and severe Migraine- Migraines are running through the family- all children have it, too. with aura, nausea, photophobia , onset at age 25.   Family medical /sleep history: a niece is the only  other family member on CPAP with OSA, insomnia.   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 Tobacco use: never .  ETOH use; none , Caffeine intake - none, but likes chocolate. She  overdid chocolate on her last B-day. Regular exercise : walking .  Hobbies : garden work.    Sleep habits are as follows: The patient's dinner time is between 5.30 PM. The patient goes to bed at 10.15 PM and has often slept already in front of the TV, continues to sleep for 7-8 hours, wakes for 3 bathroom breaks, the first time at 12, 2, 4 AM.   The preferred sleep position is left side , with the support of 1 pillow.  Dreams are reportedly frequent/vivid.  5 AM is the usual rise time. The patient wakes up spontaneously.  She reports not feeling refreshed or restored in AM, with symptoms such as dry mouth, and residual fatigue.  Naps are taken -unscheduled-she falls asleep whenever she is at rest.    Review of Systems: Out of a complete 14 system review, the patient complains of only the following symptoms, and all other reviewed systems are negative.:  Fatigue, sleepiness , snoring, fragmented sleep, hypersomnia.      How likely are you to doze in the following situations: 0 = not likely, 1 = slight chance, 2 = moderate chance, 3 = high chance   Sitting and Reading? Watching Television? Sitting inactive in a public place (theater or meeting)? As a passenger in a car for an hour without a break? Lying down in the afternoon when circumstances permit? Sitting and  talking to someone? Sitting quietly after lunch without alcohol? In a car, while stopped for a few minutes in traffic?   Total = 14/ 24 points   FSS endorsed at 35/ 63 points.   Social History   Socioeconomic History  . Marital status: Married    Spouse name: Not on file  . Number of children: Not on file  . Years of education: Not on file  . Highest education level: Not on file  Occupational History  . Not on file  Tobacco Use  . Smoking status: Never Smoker  . Smokeless tobacco: Never Used  Substance and Sexual Activity  . Alcohol use: No  . Drug use: No  . Sexual activity: Yes    Birth control/protection:  Surgical, Post-menopausal  Other Topics Concern  . Not on file  Social History Narrative  . Not on file   Social Determinants of Health   Financial Resource Strain:   . Difficulty of Paying Living Expenses:   Food Insecurity:   . Worried About Programme researcher, broadcasting/film/video in the Last Year:   . Barista in the Last Year:   Transportation Needs:   . Freight forwarder (Medical):   [name] Lack of Transportation (Non-Medical):   Physical Activity:   . Days of Exercise per Week:   . Minutes of Exercise per Session:   Stress:   . Feeling of Stress :   Social Connections:   . Frequency of Communication with Friends and Family:   . Frequency of Social Gatherings with Friends and Family:   . Attends Religious Services:   . Active Member of Clubs or Organizations:   . Attends Banker Meetings:   [name] Marital Status:     Family History  Problem Relation Age of Onset  . Arthritis Mother   . Hypertension Father   . Arthritis Father     Past Medical History:  Diagnosis Date  . Frequent PVCs   . H/O renal calculi   . Migraine     Past Surgical History:  Procedure Laterality Date  . CESAREAN SECTION     x3  . COLONOSCOPY N/A 07/29/2014   Procedure: COLONOSCOPY;  Surgeon: Malissa Hippo, MD;  Location[facility];  Service: Endoscopy;  Laterality: N/A;  730 - moved to 11/30 @ 7:30  . LITHOTRIPSY    . SALPINGOOPHORECTOMY Left      Current Outpatient Medications on File Prior to Visit  Medication Sig Dispense Refill  . acetaminophen (TYLENOL) 500 MG tablet Take 500 mg by mouth as needed.    . Ascorbic Acid (VITAMIN C PO) Take 1 tablet by mouth daily.    [name] estradiol (ESTRACE) 0.1 MG/GM vaginal cream Apply small amount externally 3 x per week as needed for irritation 42.5 g 0  . metoprolol tartrate (LOPRESSOR) 25 MG tablet Take 1/2 tab - 1 tab by mouth as needed for palpitations or heart rate greater than 150 bpm (sustained rate) 30 tablet 3  . UNABLE TO FIND Take 1  tablet by mouth daily. Tumeric     . VITAMIN D PO 5,000 IU by mouth daily    . Zinc 220 (50 Zn) MG CAPS Take 1 capsule by mouth daily.     No current facility-administered medications on file prior to visit.    Allergies  Allergen Reactions  . Penicillins Hives    Physical exam:  Today's Vitals   02/13/20 0917  BP: 122/82  Pulse: 72  Weight:  158 lb (71.7 kg)  Height: 5\' 4"  (1.626 m)   Body mass index is 27.12 kg/m.   Wt Readings from Last 3 Encounters:  02/13/20 158 lb (71.7 kg)  01/21/20 159 lb 12.8 oz (72.5 kg)  01/02/20 166 lb 6.4 oz (75.5 kg)     Ht Readings from Last 3 Encounters:  02/13/20 5\' 4"  (1.626 m)  01/21/20 5\' 5"  (1.651 m)  01/02/20 5' 5.5" (1.664 m)      General: The patient is awake, alert and appears not in acute distress. The patient is well groomed. Head: Normocephalic, atraumatic. Neck is supple. Mallampati 3- very stubby uvula, pale, no acid refux. ,  neck circumference:15 inches . Nasal airflow  patent.  Retrognathia is seen. Large tongue.  Dental status: intact,  Cardiovascular:  Regular rate and cardiac rhythm by pulse,  without distended neck veins. Respiratory: Lungs are clear to auscultation.  Skin:  Without evidence of ankle edema, or rash. Trunk: The patient's posture is erect.   Neurologic exam : The patient is awake and alert, oriented to place and time.   Memory subjective described as intact.  Attention span & concentration ability appears normal.  Speech is fluent,  without  dysarthria, dysphonia or aphasia.  Mood and affect are appropriate.   Cranial nerves: no loss of smell or taste reported - lost these senses in April. Fully vaccinated   Pupils are equal and briskly reactive to light. Funduscopic exam deferred.   Extraocular movements in vertical and horizontal planes were intact and without nystagmus. No Diplopia. Visual fields by finger perimetry are intact. Hearing was intact to soft voice and finger rubbing. Facial  sensation intact to fine touch. Facial motor strength is symmetric and tongue and uvula move midline.  Neck ROM : rotation, tilt and flexion extension were normal for age and shoulder shrug was symmetrical.    Motor exam:  Symmetric bulk, tone and ROM.   Normal tone without cog wheeling, symmetric grip strength .  Sensory:  Fine touch, pinprick and vibration were tested  and  normal.  Proprioception tested in the upper extremities was normal.  Coordination: Rapid alternating movements in the fingers/hands were of normal speed.  The Finger-to-nose maneuver was intact without evidence of ataxia, dysmetria or tremor.  Gait and station: Patient could rise unassisted from a seated position, walked without assistive device.  Stance is of normal width/ base and the patient turned with 3 steps.  Toe and heel walk were deferred.  Deep tendon reflexes: in the upper and lower extremities are symmetric and intact.  Babinski response was deferred .        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  When she saw Dr. Bronson Ing on 02 Jan 2020 her phone had registered heart rates at home as high as 200 bpm.  She has otherwise a very healthy lifestyle she is usually not taking caffeine, she has suffered from migraines since age 48 but they have recently not been active.  The only thing she could explain herself was  that she may have had a lot of chocolate cake in her birthday and the week after.  So at this time we will looking to find out if obstructive  sleep apnea could have led to these tachycardic episodes but also if her elevated pulmonary pressure artery pressure may be related to obstructive sleep apnea.  We should be able to do this either in an attended sleep study or a home sleep test.  Unfortunately a home sleep test devices have been booked out for almost 6 weeks at this point that it would be easier and faster to get Loretta Carpenter into the sleep lab overnight to clarify the apnea question.  I will order both tests and hope that her insurance will allow [place].     My Plan is to proceed with:  HST and PSG, can be split as patient is fully vaccinated.   I would like to thank Dr. Purvis Sheffield, MD and Merlyn Albert, Md [address] [place],  [place] [address] for allowing me to meet with and to take care of this pleasant patient.   In short, Loretta Carpenter will  follow up either personally or through our NP within 2-3 month.   CC: I will share my notes with PCP  Electronically signed by: Melvyn Novas, MD 02/13/2020 9:24 AM  [facility] Board certified by The [employer] of Sleep Medicine and Diplomate of the [employer] of Sleep Medicine. Board certified In Neurology through the ABPN, Fellow of the [employer] of Neurology. Medical Director of [facility].

## 2020-03-07 ENCOUNTER — Ambulatory Visit: Payer: 59 | Admitting: Cardiovascular Disease

## 2020-03-21 ENCOUNTER — Other Ambulatory Visit: Payer: Self-pay

## 2020-03-21 ENCOUNTER — Ambulatory Visit (INDEPENDENT_AMBULATORY_CARE_PROVIDER_SITE_OTHER): Payer: 59 | Admitting: Physician Assistant

## 2020-03-21 ENCOUNTER — Encounter: Payer: Self-pay | Admitting: Physician Assistant

## 2020-03-21 VITALS — BP 118/78 | HR 71 | Ht 64.5 in | Wt 156.0 lb

## 2020-03-21 DIAGNOSIS — I479 Paroxysmal tachycardia, unspecified: Secondary | ICD-10-CM | POA: Diagnosis not present

## 2020-03-21 DIAGNOSIS — R002 Palpitations: Secondary | ICD-10-CM

## 2020-03-21 MED ORDER — METOPROLOL SUCCINATE ER 25 MG PO TB24
25.0000 mg | ORAL_TABLET | Freq: Every day | ORAL | 3 refills | Status: DC
Start: 2020-03-21 — End: 2020-06-23

## 2020-03-21 NOTE — Patient Instructions (Signed)
Medication Instructions:  Your physician has recommended you make the following change in your medication:  Start Toprol XL 25 mg Daily ( May take Lopressor as needed)  Stop Caffeine   *If you need a refill on your cardiac medications before your next appointment, please call your pharmacy*   Lab Work: NONE  If you have labs (blood work) drawn today and your tests are completely normal, you will receive your results only by: [name] MyChart Message (if you have MyChart) OR . A paper copy in the mail If you have any lab test that is abnormal or we need to change your treatment, we will call you to review the results.   Testing/Procedures: NONE   Follow-Up: At [facility], you and your health needs are our priority.  As part of our continuing mission to provide you with exceptional heart care, we have created designated Provider Care Teams.  These Care Teams include your primary Cardiologist (physician) and Advanced Practice Providers (APPs -  Physician Assistants and Nurse Practitioners) who all work together to provide you with the care you need, when you need it.  We recommend signing up for the patient portal called "MyChart".  Sign up information is provided on this After Visit Summary.  MyChart is used to connect with patients for Virtual Visits (Telemedicine).  Patients are able to view lab/test results, encounter notes, upcoming appointments, etc.  Non-urgent messages can be sent to your provider as well.   To learn more about what you can do with MyChart, go to [email or web address].    Your next appointment:   1 year(s)  The format for your next appointment:   In Person  Provider:   Dietrich Pates, MD   Other Instructions Thank you for choosing [facility]!

## 2020-03-21 NOTE — Progress Notes (Signed)
Cardiology Office Note   Date:  03/21/2020   ID:  Carpenter Loretta, DOB 1961/01/03, MRN [id number]  PCP:  Merlyn Albert, MD (Inactive) Cardiologist:  Prentice Docker, MD (Inactive) 01/21/2020 televisit Electrphysiologist: None Theodore Demark, PA-C   No chief complaint on file.   History of Present Illness: Loretta Carpenter is a 59 y.o. female with a history of tachycardia/palpitations, monitor results below, probable sleep apnea being evaluated  Loretta Carpenter presents for cardiology follow up.  When she gets an episode, she Loretta feel like she is going to get SOB. She Loretta cough or pat her chest and the sx Loretta resolve.   When she was diagnosed w/ PVCs at age 16, she went off caffeine completely. After about 10 years, she started gradually adding back chocolate.   Recently, with her birthday, she had friends and family making her various chocolate deserts. After that is when she had the episode where she had prolonged palpitations.   Most of the time, the palpitations don't last but a few seconds. However, they bother her when they happen frequently. Sometimes multiple times a week, sometimes > 1 week without anything happening.  She has been worried about the palpitations, concerned because both her parents have Afib. One of her early episodes of tachycardia showed a HR of > 150, confirmed visually. It was irregular, but there was enough artifact and only 1 lead, so difficult to say.    Past Medical History:  Diagnosis Date  . Frequent PVCs    dx age 89  . H/O renal calculi   . Migraine   . Palpitations 2021   short runs ventricular and atrial ectopy on outpt monitor    Past Surgical History:  Procedure Laterality Date  . CESAREAN SECTION     x3  . COLONOSCOPY N/A 07/29/2014   Procedure: COLONOSCOPY;  Surgeon: Malissa Hippo, MD;  Location: [facility];  Service: Endoscopy;  Laterality: N/A;  730 - moved to 11/30 @ 7:30  . LITHOTRIPSY    . SALPINGOOPHORECTOMY Left       Current Outpatient Medications  Medication Sig Dispense Refill  . acetaminophen (TYLENOL) 500 MG tablet Take 500 mg by mouth as needed for mild pain or moderate pain.     . Ascorbic Acid (VITAMIN C PO) Take 1 tablet by mouth daily.    [name] estradiol (ESTRACE) 0.1 MG/GM vaginal cream Apply small amount externally 3 x per week as needed for irritation 42.5 g 0  . metoprolol tartrate (LOPRESSOR) 25 MG tablet Take 1/2 tab - 1 tab by mouth as needed for palpitations or heart rate greater than 150 bpm (sustained rate) 30 tablet 3  . UNABLE TO FIND Take 1 tablet by mouth daily. Tumeric     . VITAMIN D PO 5,000 IU by mouth daily    . Zinc 220 (50 Zn) MG CAPS Take 1 capsule by mouth daily.     No current facility-administered medications for this visit.    Allergies:   Penicillins    Social History:  The patient  reports that she has never smoked. She has never used smokeless tobacco. She reports that she does not drink alcohol and does not use drugs.   Family History:  The patient's family history includes Arthritis in her father and mother; Hypertension in her father.  She indicated that her mother is alive. She indicated that her father is alive.   ROS:  Please see the history of present illness.  All other systems are reviewed and negative.    PHYSICAL EXAM: VS:  BP 118/78   Pulse 71   Ht 5' 4.5" (1.638 m)   Wt 156 lb (70.8 kg)   SpO2 98%   BMI 26.36 kg/m  , BMI Body mass index is 26.36 kg/m. GEN: Well nourished, well developed, female in no acute distress HEENT: normal for age  Neck: no JVD, no carotid bruit, no masses Cardiac: RRR; no murmur, no rubs, or gallops Respiratory:  clear to auscultation bilaterally, normal work of breathing GI: soft, nontender, nondistended, + BS MS: no deformity or atrophy; no edema; distal pulses are 2+ in all 4 extremities  Skin: warm and dry, no rash Neuro:  Strength and sensation are intact Psych: euthymic mood, full affect   EKG:  EKG is  not ordered today.  Echocardiogram 01/14/2020:  1. Left ventricular ejection fraction, by estimation, is >75%. The left  ventricle has hyperdynamic function. The left ventricle has no regional  wall motion abnormalities. There is mild left ventricular hypertrophy.  Left ventricular diastolic parameters  are indeterminate.  2. Right ventricular systolic function is normal. The right ventricular  size is normal. There is mildly elevated pulmonary artery systolic  pressure.  3. The mitral valve is normal in structure. No evidence of mitral valve  regurgitation. No evidence of mitral stenosis.  4. The aortic valve was not well visualized. Aortic valve regurgitation  is not visualized. No aortic stenosis is present.  5. The inferior vena cava is normal in size with greater than 50%  respiratory variability, suggesting right atrial pressure of 3 mmHg.   MONITOR: 02/21/2020  Predominantly sinus rhythm, avg HR 78 bpm. Isolated 4-beat run of non-sustained ventricular tachycardia. Very brief atrial run seen. No sustained arrhythmias.  Some bradycardia, HR 48 during sleep    Recent Labs: 01/03/2020: ALT 22; BUN 17; Creatinine, Ser 0.55; Hemoglobin 14.3; Magnesium 2.1; Platelets 225; Potassium 4.0; Sodium 138; TSH 1.241  CBC    Component Value Date/Time   WBC 7.4 01/03/2020 0817   RBC 5.09 01/03/2020 0817   HGB 14.3 01/03/2020 0817   HGB 14.8 08/27/2017 0941   HCT 45.2 01/03/2020 0817   HCT 45.9 08/27/2017 0941   PLT 225 01/03/2020 0817   PLT 224 08/27/2017 0941   MCV 88.8 01/03/2020 0817   MCV 90 08/27/2017 0941   MCH 28.1 01/03/2020 0817   MCHC 31.6 01/03/2020 0817   RDW 13.1 01/03/2020 0817   RDW 13.9 08/27/2017 0941   LYMPHSABS 1.6 08/27/2017 0941   MONOABS 0.4 03/14/2014 0416   EOSABS 0.3 08/27/2017 0941   BASOSABS 0.0 08/27/2017 0941   CMP Latest Ref Rng & Units 01/03/2020 08/27/2017 03/14/2014  Glucose 70 - 99 mg/dL 96 81 101(B)  BUN 6 - 20 mg/dL 17 13 22   Creatinine  0.44 - 1.00 mg/dL 5.10 2.58  Sodium 135 - 145 mmol/L 138 137 142  Potassium 3.5 - 5.1 mmol/L 4.0 4.3 3.6(L)  Chloride 98 - 111 mmol/L 105 100 104  CO2 22 - 32 mmol/L 24 23 20   Calcium 8.9 - 10.3 mg/dL 9.0 5.27 9.4  Total Protein 6.5 - 8.1 g/dL 7.2 7.1 -  Total Bilirubin 0.3 - 1.2 mg/dL ) 78.2) -  Alkaline Phos 38 - 126 U/L 65 66 -  AST 15 - 41 U/L 20 19 -  ALT 0 - 44 U/L 22 18 -   Magnesium  Date Value Ref Range Status  01/03/2020 2.1 1.7 - 2.4 mg/dL  Final    Comment:    Performed at [facility], [address], [place], [place] [address]    Lipid Panel Lab Results  Component Value Date   CHOL 222 (H) 08/27/2017   HDL 58 08/27/2017   LDLCALC 146 (H) 08/27/2017   TRIG 88 08/27/2017   CHOLHDL 3.8 08/27/2017      Wt Readings from Last 3 Encounters:  03/21/20 156 lb (70.8 kg)  02/13/20 158 lb (71.7 kg)  01/21/20 159 lb 12.8 oz (72.5 kg)     Other studies Reviewed: Additional studies/ records that were reviewed today include: Office notes, hospital records and testing.  ASSESSMENT AND PLAN:  1.  Palpitations: -No critical arrhythmias on monitor. -After the episode of prolonged palpitations, she has been much stricter about caffeine, her major source is chocolate.  She does not do sodas or coffee -She is symptomatic with the palpitations.  We discussed treatment versus prevention and she wishes to try some prevention. -She is encouraged to severely limit caffeine and be sure she stays hydrated. -Her electrolytes are fine, potassium 4.0 and magnesium 2.1 -I Loretta start Toprol-XL 25 mg daily.  She understands that it is okay to take the short acting metoprolol if she gets prolonged palpitations. -With a resting heart rate of 48 documented on recent monitor, do not wish to push the beta-blocker too high, but I feel she Loretta tolerate a low dose  2.  Possible sleep apnea: -She is to continue evaluation for this  I was able to reassure her that the palpitations she  feels are not shown to be anything harmful to her and her heart muscle function is normal.  Greater than 30 minutes was spent with the patient reviewing test results and determining a plan of care.   Current medicines are reviewed at length with the patient today.  The patient does not have concerns regarding medicines.  The following changes have been made: Add daily Toprol-XL in addition to the as needed Lopressor  Labs/ tests ordered today include:  No orders of the defined types were placed in this encounter.    Disposition:   FU with MD in a year  Signed, Theodore Demark, PA-C  03/21/2020 2:03 PM    [facility] Phone: [phone]; Fax: [phone]

## 2020-05-29 ENCOUNTER — Ambulatory Visit: Payer: 59 | Attending: Internal Medicine

## 2020-05-29 DIAGNOSIS — Z23 Encounter for immunization: Secondary | ICD-10-CM

## 2020-05-29 NOTE — Progress Notes (Signed)
   Covid-19 Vaccination Clinic  Name:  Loretta Carpenter    MRN: [id number] DOB: Apr 22, 1961  05/29/2020  Loretta Carpenter was observed post Covid-19 immunization for 30 minutes based on pre-vaccination screening without incident. She was provided with Vaccine Information Sheet and instruction to access the V-Safe system.   Loretta Carpenter was instructed to call 911 with any severe reactions post vaccine: [name] Difficulty breathing  . Swelling of face and throat  . A fast heartbeat  . A bad rash all over body  . Dizziness and weakness

## 2020-06-02 ENCOUNTER — Telehealth: Payer: Self-pay

## 2020-06-02 NOTE — Telephone Encounter (Signed)
LVM on home number to RS sleep study. Voicemail full on cell phone.

## 2020-06-23 ENCOUNTER — Other Ambulatory Visit: Payer: Self-pay | Admitting: Physician Assistant

## 2020-06-23 MED ORDER — METOPROLOL SUCCINATE ER 25 MG PO TB24: 25.0000 mg | ORAL_TABLET | Freq: Every day | ORAL | 3 refills | Status: DC

## 2020-06-23 MED FILL — METOPROLOL SUCCINATE ER 25: 25 | 90 days supply | Qty: 90 | Fill #0

## 2020-06-23 NOTE — Telephone Encounter (Signed)
refilled to [facility] pharmacy

## 2020-06-23 NOTE — Telephone Encounter (Signed)
New message      *STAT* If patient is at the pharmacy, call can be transferred to refill team.   1. Which medications need to be refilled? (please list name of each medication and dose if known) metoprolol succinate (TOPROL XL) 25 MG 24 hr tablet  2. Which pharmacy/location (including street and city if local pharmacy) is medication to be sent to [facility]  3. Do they need a 30 day or 90 day supply?  90 not 30

## 2020-09-18 MED FILL — METOPROLOL SUCCINATE ER 25: 25 | 90 days supply | Qty: 90 | Fill #1

## 2020-12-09 ENCOUNTER — Other Ambulatory Visit (HOSPITAL_COMMUNITY): Payer: Self-pay

## 2020-12-25 ENCOUNTER — Other Ambulatory Visit (HOSPITAL_COMMUNITY): Payer: Self-pay

## 2020-12-25 MED FILL — Metoprolol Succinate Tab ER 24HR 25 MG (Tartrate Equiv): ORAL | 90 days supply | Qty: 90 | Fill #0 | Status: AC

## 2021-01-24 ENCOUNTER — Telehealth: Payer: 59 | Admitting: Orthopedic Surgery

## 2021-01-24 DIAGNOSIS — R399 Unspecified symptoms and signs involving the genitourinary system: Secondary | ICD-10-CM

## 2021-01-24 MED ORDER — NITROFURANTOIN MONOHYD MACRO 100 MG PO CAPS: 100.0000 mg | ORAL_CAPSULE | Freq: Two times a day (BID) | ORAL | 0 refills | Status: AC

## 2021-01-24 NOTE — Progress Notes (Signed)
[name], hinds are scheduled for a virtual visit with your provider today.    Just as we do with appointments in the office, we must obtain your consent to participate.  Your consent will be active for this visit and any virtual visit you may have with one of our providers in the next 365 days.    If you have a MyChart account, I can also send a copy of this consent to you electronically.  All virtual visits are billed to your insurance company just like a traditional visit in the office.  As this is a virtual visit, video technology does not allow for your provider to perform a traditional examination.  This may limit your provider's ability to fully assess your condition.  If your provider identifies any concerns that need to be evaluated in person or the need to arrange testing such as labs, EKG, etc, we will make arrangements to do so.    Although advances in technology are sophisticated, we cannot ensure that it will always work on either your end or our end.  If the connection with a video visit is poor, we may have to switch to a telephone visit.  With either a video or telephone visit, we are not always able to ensure that we have a secure connection.   I need to obtain your verbal consent now.   Are you willing to proceed with your visit today?   Loretta Carpenter has provided verbal consent on 01/24/2021 for a virtual visit (video or telephone).   Loretta Caldron, PA-C 01/24/2021  11:54 AM    Subjective: Pt called in with classic symptoms of UTI with frequency, urgency, and burning. Denies fever. Requested Cipro.    Assessment/Plan: UTI symptoms -- Explained the need to try to limit FQ rx. Will rx Macrobid bid x5d.    Loretta Caldron, PA-C 01/24/2021

## 2021-03-25 ENCOUNTER — Other Ambulatory Visit (HOSPITAL_COMMUNITY): Payer: Self-pay

## 2021-03-25 MED FILL — Metoprolol Succinate Tab ER 24HR 25 MG (Tartrate Equiv): ORAL | 90 days supply | Qty: 90 | Fill #1 | Status: AC

## 2021-04-05 ENCOUNTER — Telehealth: Payer: 59 | Admitting: Physician Assistant

## 2021-04-05 DIAGNOSIS — R300 Dysuria: Secondary | ICD-10-CM | POA: Diagnosis not present

## 2021-04-05 MED ORDER — CIPROFLOXACIN HCL 500 MG PO TABS: 500.0000 mg | ORAL_TABLET | Freq: Two times a day (BID) | ORAL | 0 refills | Status: AC

## 2021-04-05 MED ORDER — FLUCONAZOLE 150 MG PO TABS
150.0000 mg | ORAL_TABLET | Freq: Once | ORAL | 0 refills | Status: AC
Start: 2021-04-05 — End: 2021-04-05

## 2021-04-05 NOTE — Progress Notes (Signed)
Virtual Visit Consent   Loretta Carpenter, you are scheduled for a virtual visit with a [facility] provider today.     Just as with appointments in the office, your consent must be obtained to participate.  Your consent will be active for this visit and any virtual visit you may have with one of our providers in the next 365 days.     If you have a MyChart account, a copy of this consent can be sent to you electronically.  All virtual visits are billed to your insurance company just like a traditional visit in the office.    As this is a virtual visit, video technology does not allow for your provider to perform a traditional examination.  This may limit your provider's ability to fully assess your condition.  If your provider identifies any concerns that need to be evaluated in person or the need to arrange testing (such as labs, EKG, etc.), we will make arrangements to do so.     Although advances in technology are sophisticated, we cannot ensure that it will always work on either your end or our end.  If the connection with a video visit is poor, the visit may have to be switched to a telephone visit.  With either a video or telephone visit, we are not always able to ensure that we have a secure connection.     I need to obtain your verbal consent now.   Are you willing to proceed with your visit today?    Loretta Carpenter has provided verbal consent on 04/05/2021 for a virtual visit (video or telephone).   Piedad Climes, [place]   Date: 04/05/2021 1:51 PM   Virtual Visit via Video Note   I, Piedad Climes, connected with  Loretta Carpenter  ([id number], 25-Apr-1961) on 04/05/21 at  1:45 PM EDT by a video-enabled telemedicine application and verified that I am speaking with the correct person using two identifiers.  Location: Patient: Virtual Visit Location Patient: Home Provider: Virtual Visit Location Provider: Home Office   I discussed the limitations of evaluation and management by  telemedicine and the availability of in person appointments. The patient expressed understanding and agreed to proceed.    History of Present Illness: Loretta Carpenter is a 60 y.o. who identifies as a female who was assigned female at birth, and is being seen today for possible UTI. Patient endorses urinary urgency, frequency, foul-smelling urine along with dysuria. Notes suprapubic pressure and hesitancy.  Notes prior history of UTI and this feels identical to prior episodes.  Denies fever, chills, vomiting or flank pain. Has been taking OTC AZO to help.   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 , Disp: , Rfl:    Ascorbic Acid (VITAMIN C PO), Take 1 tablet by mouth daily., Disp: , Rfl:    metoprolol succinate (TOPROL-XL) 25 MG 24 hr tablet, TAKE 1 TABLET (25 MG TOTAL) BY MOUTH DAILY., Disp: 90 tablet, Rfl: 3   metoprolol tartrate (LOPRESSOR) 25 MG tablet,  Take 1/2 tab - 1 tab by mouth as needed for palpitations or heart rate greater than 150 bpm (sustained rate), Disp: 30 tablet, Rfl: 3   UNABLE TO FIND, Take 1 tablet by mouth daily. Tumeric , Disp: , Rfl:    VITAMIN D PO, 5,000 IU by mouth daily, Disp: , Rfl:    Zinc 220 (50 Zn) MG CAPS, Take 1 capsule by mouth daily., Disp: , Rfl:   Observations/Objective: Patient is well-developed, well-nourished in no acute distress.  Resting comfortably at home.  Head is normocephalic, atraumatic.  No labored breathing. Speech is clear and coherent with logical content.  Patient is alert and oriented at  baseline.   Assessment and Plan: 1. Dysuria Last UTI in May -- given Macrobid which helped but she did not complete course. States she always requires Cipro to prevent recurrent UTI and is requesting this. Discussed other options versus Cipro and possible ADRs. She would like to proceed. Rx sent for 500 mg BID x 5 days. Supportive measures and OTC medications reviewed with patient.   Follow Up Instructions: I discussed the assessment and treatment plan with the patient. The patient was provided an opportunity to ask questions and all were answered. The patient agreed with the plan and demonstrated an understanding of the instructions.  A copy of instructions were sent to the patient via MyChart.  The patient was advised to call back or seek an in-person evaluation if the symptoms worsen or if the condition fails to improve as anticipated.  Time:  I spent 15 minutes with the patient via telehealth technology discussing the above problems/concerns.    Piedad Climes, PA-C

## 2021-04-05 NOTE — Patient Instructions (Signed)
Loretta Carpenter, thank you for joining Piedad Climes, PA-C for today's virtual visit.  While this provider is not your primary care provider (PCP), if your PCP is located in our provider database this encounter information Loretta be shared with them immediately following your visit.  Consent: (Patient) Loretta Carpenter provided verbal consent for this virtual visit at the beginning of the encounter.  Current Medications:  Current Outpatient Medications:    acetaminophen (TYLENOL) 500 MG tablet, Take 500 mg by mouth as needed for mild pain or moderate pain. , Disp: , Rfl:    Ascorbic Acid (VITAMIN C PO), Take 1 tablet by mouth daily., Disp: , Rfl:    estradiol (ESTRACE) 0.1 MG/GM vaginal cream, Apply small amount externally 3 x per week as needed for irritation, Disp: 42.5 g, Rfl: 0   metoprolol succinate (TOPROL-XL) 25 MG 24 hr tablet, TAKE 1 TABLET (25 MG TOTAL) BY MOUTH DAILY., Disp: 90 tablet, Rfl: 3   metoprolol tartrate (LOPRESSOR) 25 MG tablet, Take 1/2 tab - 1 tab by mouth as needed for palpitations or heart rate greater than 150 bpm (sustained rate), Disp: 30 tablet, Rfl: 3   UNABLE TO FIND, Take 1 tablet by mouth daily. Tumeric , Disp: , Rfl:    VITAMIN D PO, 5,000 IU by mouth daily, Disp: , Rfl:    Zinc 220 (50 Zn) MG CAPS, Take 1 capsule by mouth daily., Disp: , Rfl:    Medications ordered in this encounter:  No orders of the defined types were placed in this encounter.    *If you need refills on other medications prior to your next appointment, please contact your pharmacy*  Follow-Up: Call back or seek an in-person evaluation if the symptoms worsen or if the condition fails to improve as anticipated.  Other Instructions Your symptoms are consistent with a bladder infection, also called acute cystitis. Please take your antibiotic (Cipro) as directed until all pills are gone.  Stay very well hydrated.  Consider a daily probiotic (Align, Culturelle, or Activia) to help prevent  stomach upset caused by the antibiotic.  Taking a probiotic daily may also help prevent recurrent UTIs.  Also consider taking AZO (Phenazopyridine) tablets to help decrease pain with urination.    Urinary Tract Infection A urinary tract infection (UTI) can occur any place along the urinary tract. The tract includes the kidneys, ureters, bladder, and urethra. A type of germ called bacteria often causes a UTI. UTIs are often helped with antibiotic medicine.  HOME CARE  If given, take antibiotics as told by your doctor. Finish them even if you start to feel better. Drink enough fluids to keep your pee (urine) clear or pale yellow. Avoid tea, drinks with caffeine, and bubbly (carbonated) drinks. Pee often. Avoid holding your pee in for a long time. Pee before and after having sex (intercourse). Wipe from front to back after you poop (bowel movement) if you are a woman. Use each tissue only once. GET HELP RIGHT AWAY IF:  You have back pain. You have lower belly (abdominal) pain. You have chills. You feel sick to your stomach (nauseous). You throw up (vomit). Your burning or discomfort with peeing does not go away. You have a fever. Your symptoms are not better in 3 days. MAKE SURE YOU:  Understand these instructions. Loretta watch your condition. Loretta get help right away if you are not doing well or get worse. Document Released: 02/02/2008 Document Revised: 05/10/2012 Document Reviewed: 03/16/2012 ExitCare Patient Information 2015 [employer]. This information is not intended to replace advice given to you by your health care provider. Make sure you discuss any questions you have with your health care provider.    If you have been instructed to have an in-person evaluation today at a local Urgent Care facility, please use the link below. It Loretta take you to a list of all of our available [facility], including address, phone number and hours of operation. Please do not delay  care.  [facility]  If you or a family member do not have a primary care provider, use the link below to schedule a visit and establish care. When you choose a [facility] primary care physician or advanced practice provider, you gain a long-term partner in health. Find a Primary Care Provider  Learn more about [facility]'s in-office and virtual care options: [facility] - Get Care Now

## 2021-06-18 ENCOUNTER — Other Ambulatory Visit (HOSPITAL_COMMUNITY): Payer: Self-pay

## 2021-06-18 ENCOUNTER — Other Ambulatory Visit: Payer: Self-pay

## 2021-06-18 ENCOUNTER — Telehealth: Payer: Self-pay | Admitting: *Deleted

## 2021-06-18 ENCOUNTER — Other Ambulatory Visit (HOSPITAL_COMMUNITY)
Admission: RE | Admit: 2021-06-18 | Discharge: 2021-06-18 | Disposition: A | Discharge status: Home / Self Care | Payer: 59 | Source: Ambulatory Visit | Attending: Cardiology | Admitting: Cardiology

## 2021-06-18 ENCOUNTER — Encounter: Payer: Self-pay | Admitting: Cardiology

## 2021-06-18 ENCOUNTER — Ambulatory Visit: Payer: 59 | Admitting: Cardiology

## 2021-06-18 VITALS — BP 128/80 | HR 61 | Ht 65.0 in | Wt 171.2 lb

## 2021-06-18 DIAGNOSIS — G4719 Other hypersomnia: Secondary | ICD-10-CM | POA: Diagnosis not present

## 2021-06-18 DIAGNOSIS — R002 Palpitations: Secondary | ICD-10-CM

## 2021-06-18 DIAGNOSIS — Z13220 Encounter for screening for lipoid disorders: Secondary | ICD-10-CM

## 2021-06-18 LAB — COMPREHENSIVE METABOLIC PANEL
ALT: 27 U/L (ref 0–44)
AST: 22 U/L (ref 15–41)
Albumin: 4.4 g/dL (ref 3.5–5.0)
Alkaline Phosphatase: 70 U/L (ref 38–126)
Anion gap: 6 (ref 5–15)
BUN: 20 mg/dL (ref 6–20)
CO2: 28 mmol/L (ref 22–32)
Calcium: 9.3 mg/dL (ref 8.9–10.3)
Chloride: 104 mmol/L (ref 98–111)
Creatinine, Ser: 0.69 mg/dL (ref 0.44–1.00)
GFR, Estimated: >60 mL/min (ref >60)
Glucose, Bld: 95 mg/dL (ref 70–99)
Potassium: 4.1 mmol/L (ref 3.5–5.1)
Sodium: 138 mmol/L (ref 135–145)
Total Bilirubin: 1.7 mg/dL — ABNORMAL HIGH (ref 0.3–1.2)
Total Protein: 7.6 g/dL (ref 6.5–8.1)

## 2021-06-18 LAB — LIPID PANEL
Cholesterol: 230 mg/dL — ABNORMAL HIGH (ref 0–200)
HDL: 50 mg/dL (ref >40)
LDL Cholesterol: 166 mg/dL — ABNORMAL HIGH (ref 0–99)
Total CHOL/HDL Ratio: 4.6 RATIO
Triglycerides: 72 mg/dL (ref <150)
VLDL: 14 mg/dL (ref 0–40)

## 2021-06-18 LAB — CBC
HCT: 49.7 % — ABNORMAL HIGH (ref 36.0–46.0)
Hemoglobin: 15.8 g/dL — ABNORMAL HIGH (ref 12.0–15.0)
MCH: 29.1 pg (ref 26.0–34.0)
MCHC: 31.8 g/dL (ref 30.0–36.0)
MCV: 91.5 fL (ref 80.0–100.0)
Platelets: 227 10*3/uL (ref 150–400)
RBC: 5.43 MIL/uL — ABNORMAL HIGH (ref 3.87–5.11)
RDW: 13.3 % (ref 11.5–15.5)
WBC: 5.3 10*3/uL (ref 4.0–10.5)
nRBC: 0 % (ref 0.0–0.2)

## 2021-06-18 LAB — MAGNESIUM: Magnesium: 2.4 mg/dL (ref 1.7–2.4)

## 2021-06-18 LAB — TSH: TSH: 1.46 u[IU]/mL (ref 0.350–4.500)

## 2021-06-18 MED ORDER — ATORVASTATIN CALCIUM 20 MG PO TABS
20.0000 mg | ORAL_TABLET | Freq: Every day | ORAL | 3 refills | Status: DC
Start: 2021-06-18 — End: 2023-05-10
  Filled 2021-06-18: qty 90, 90d supply, fill #0

## 2021-06-18 MED ORDER — METOPROLOL SUCCINATE ER 25 MG PO TB24: ORAL_TABLET | Freq: Every day | ORAL | 3 refills | Status: DC

## 2021-06-18 NOTE — Addendum Note (Signed)
Addended by: Theresia Majors on: 06/18/2021 08:52 AM   Modules accepted: Orders

## 2021-06-18 NOTE — Addendum Note (Signed)
Addended by: Theresia Majors on: 06/18/2021 08:48 AM   Modules accepted: Orders

## 2021-06-18 NOTE — Telephone Encounter (Signed)
Pt notified and voiced understanding 

## 2021-06-18 NOTE — Patient Instructions (Signed)
Medication Instructions:  Your physician recommends that you continue on your current medications as directed. Please refer to the Current Medication list given to you today.  *If you need a refill on your cardiac medications before your next appointment, please call your pharmacy*   Lab Work: TODAY: CMET, TSH, Mg, FLP  If you have labs (blood work) drawn today and your tests are completely normal, you will receive your results only by: MyChart Message (if you have MyChart) OR A paper copy in the mail If you have any lab test that is abnormal or we need to change your treatment, we will call you to review the results.   Testing/Procedures: Your physician has recommended that you have a sleep study. This test records several body functions during sleep, including: brain activity, eye movement, oxygen and carbon dioxide blood levels, heart rate and rhythm, breathing rate and rhythm, the flow of air through your mouth and nose, snoring, body muscle movements, and chest and belly movement.  Follow-Up: At [facility], you and your health needs are our priority.  As part of our continuing mission to provide you with exceptional heart care, we have created designated Provider Care Teams.  These Care Teams include your primary Cardiologist (physician) and Advanced Practice Providers (APPs -  Physician Assistants and Nurse Practitioners) who all work together to provide you with the care you need, when you need it.  Your next appointment:   3 month(s)  The format for your next appointment:   In Person  Provider:   You may see Armanda Magic, MD or one of the following Advanced Practice Providers on your designated Care Team:   [place], PA-C  Jacolyn Reedy, [place]

## 2021-06-18 NOTE — Telephone Encounter (Signed)
-----   Message from Quintella Reichert, MD sent at 06/18/2021 11:01 AM EDT ----- Normal CBC.  Normal TSH.  LDL goal < 100 and too high today.  Start Lipitor 20mg  daily and repeat FLP and ALT in 6 weeks.  TBili mildly elevated and c/w with known Gilbert's syndrome

## 2021-06-18 NOTE — Progress Notes (Signed)
Cardiology Office Note   Date:  06/18/2021   ID:  Loretta Carpenter, DOB Dec 16, 1960, MRN [id number]  PCP:  Pcp, No Cardiologist:  Armanda Magic, MD Electrphysiologist: None Armanda Magic, MD   Chief Complaint  Patient presents with   Palpitations    History of Present Illness: Loretta Carpenter is a 60 y.o. female with a history of tachycardia/palpitations.  SHe has also been evaluated for OSA by Neuro.  When she was diagnosed w/ PVCs at age 60, she went off caffeine completely. After about 10 years, she started gradually adding back chocolate. She was seen back by Cards after 60 celebrating her birthday with friends and family who made her various chocolate deserts. After that is when she had the episode where she had prolonged palpitations. Event monitor showed an isolated episode of 4 beats of NSVT.  2D echo showed normal LVF with EF>75% with normal PAP.   She is here today for followup and is doing well.  She denies any chest pain or pressure, SOB, DOE, PND, orthopnea, LE edema, dizziness, palpitations or syncope. She is compliant with her meds and is tolerating meds with no SE.      Past Medical History:  Diagnosis Date   Frequent PVCs    dx age 60   H/O renal calculi    Migraine    Palpitations 2021   short runs ventricular and atrial ectopy on outpt monitor    Past Surgical History:  Procedure Laterality Date   CESAREAN SECTION     x3   COLONOSCOPY N/A 07/29/2014   Procedure: COLONOSCOPY;  Surgeon: Malissa Hippo, MD;  Location: [facility];  Service: Endoscopy;  Laterality: N/A;  730 - moved to 11/30 @ 7:30   LITHOTRIPSY     SALPINGOOPHORECTOMY Left     Current Outpatient Medications  Medication Sig Dispense Refill   acetaminophen (TYLENOL) 500 MG tablet Take 500 mg by mouth as needed for mild pain or moderate pain.      Ascorbic Acid (VITAMIN C PO) Take 1 tablet by mouth daily.     metoprolol succinate (TOPROL-XL) 25 MG 24 hr tablet TAKE 1 TABLET (25 MG TOTAL) BY MOUTH  DAILY. 90 tablet 3   metoprolol tartrate (LOPRESSOR) 25 MG tablet Take 1/2 tab - 1 tab by mouth as needed for palpitations or heart rate greater than 150 bpm (sustained rate) 30 tablet 3   UNABLE TO FIND Take 1 tablet by mouth daily. Tumeric      VITAMIN D PO 5,000 IU by mouth daily     Zinc 220 (50 Zn) MG CAPS Take 1 capsule by mouth daily.     No current facility-administered medications for this visit.    Allergies:   Penicillins    Social History:  The patient  reports that she has never smoked. She has never used smokeless tobacco. She reports that she does not drink alcohol and does not use drugs.   Family History:  The patient's family history includes Arthritis in her father and mother; Hypertension in her father.  She indicated that her mother is alive. She indicated that her father is alive.   ROS:  Please see the history of present illness. All other systems are reviewed and negative.    PHYSICAL EXAM: VS:  BP 128/80 (BP Location: Left Arm, Patient Position: Sitting, Cuff Size: Normal)   Pulse 61   Ht 5\' 5"  (1.651 m)   Wt 171 lb 3.2 oz (77.7 kg)  SpO2 98%   BMI 28.49 kg/m  , BMI Body mass index is 28.49 kg/m. GEN: Well nourished, well developed in no acute distress HEENT: Normal NECK: No JVD; No carotid bruits LYMPHATICS: No lymphadenopathy CARDIAC:RRR, no murmurs, rubs, gallops RESPIRATORY:  Clear to auscultation without rales, wheezing or rhonchi  ABDOMEN: Soft, non-tender, non-distended MUSCULOSKELETAL:  No edema; No deformity  SKIN: Warm and dry NEUROLOGIC:  Alert and oriented x 3 PSYCHIATRIC:  Normal affect    EKG:  EKG is ordered today and demonstrates normal sinus rhythm with no ST changes  Echocardiogram 01/14/2020:    1. Left ventricular ejection fraction, by estimation, is >75%. The left  ventricle has hyperdynamic function. The left ventricle has no regional  wall motion abnormalities. There is mild left ventricular hypertrophy.  Left ventricular  diastolic parameters  are indeterminate.   2. Right ventricular systolic function is normal. The right ventricular  size is normal. There is mildly elevated pulmonary artery systolic  pressure.   3. The mitral valve is normal in structure. No evidence of mitral valve  regurgitation. No evidence of mitral stenosis.   4. The aortic valve was not well visualized. Aortic valve regurgitation  is not visualized. No aortic stenosis is present.   5. The inferior vena cava is normal in size with greater than 50%  respiratory variability, suggesting right atrial pressure of 3 mmHg.   MONITOR: 02/21/2020 Predominantly sinus rhythm, avg HR 78 bpm. Isolated 4-beat run of non-sustained ventricular tachycardia. Very brief atrial run seen. No sustained arrhythmias. Some bradycardia, HR 48 during sleep    Recent Labs: No results found for requested labs within last 8760 hours.  CBC    Component Value Date/Time   WBC 7.4 01/03/2020 0817   RBC 5.09 01/03/2020 0817   HGB 14.3 01/03/2020 0817   HGB 14.8 08/27/2017 0941   HCT 45.2 01/03/2020 0817   HCT 45.9 08/27/2017 0941   PLT 225 01/03/2020 0817   PLT 224 08/27/2017 0941   MCV 88.8 01/03/2020 0817   MCV 90 08/27/2017 0941   MCH 28.1 01/03/2020 0817   MCHC 31.6 01/03/2020 0817   RDW 13.1 01/03/2020 0817   RDW 13.9 08/27/2017 0941   LYMPHSABS 1.6 08/27/2017 0941   MONOABS 0.4 03/14/2014 0416   EOSABS 0.3 08/27/2017 0941   BASOSABS 0.0 08/27/2017 0941   CMP Latest Ref Rng & Units 01/03/2020 08/27/2017 03/14/2014  Glucose 70 - 99 mg/dL 96 81 914(N)  BUN 6 - 20 mg/dL 17 13 22   Creatinine 0.44 - 1.00 mg/dL 8.29 5.62  Sodium 135 - 145 mmol/L 138 137 142  Potassium 3.5 - 5.1 mmol/L 4.0 4.3 3.6(L)  Chloride 98 - 111 mmol/L 105 100 104  CO2 22 - 32 mmol/L 24 23 20   Calcium 8.9 - 10.3 mg/dL 9.0 1.30 9.4  Total Protein 6.5 - 8.1 g/dL 7.2 7.1 -  Total Bilirubin 0.3 - 1.2 mg/dL ) 86.5) -  Alkaline Phos 38 - 126 U/L 65 66 -  AST 15 - 41  U/L 20 19 -  ALT 0 - 44 U/L 22 18 -   Magnesium  Date Value Ref Range Status  01/03/2020 2.1 1.7 - 2.4 mg/dL Final    Comment:    Performed at [facility], [address], [place], [address] [place]    Lipid Panel Lab Results  Component Value Date   CHOL 222 (H) 08/27/2017   HDL 58 08/27/2017   LDLCALC 146 (H) 08/27/2017   TRIG 88 08/27/2017  CHOLHDL 3.8 08/27/2017      Wt Readings from Last 3 Encounters:  06/18/21 171 lb 3.2 oz (77.7 kg)  03/21/20 156 lb (70.8 kg)  02/13/20 158 lb (71.7 kg)     Other studies Reviewed: Additional studies/ records that were reviewed today include: Office notes, hospital records and testing.  ASSESSMENT AND PLAN:  1.  Palpitations: -She has a hx 9of PVCs since age 32 -No critical arrhythmias on monitor.  Event monitor in May 2021 showed 1 isolated episode of 4 beats of ventricular tachycardia and she was started on Toprol-XL 25 mg daily -2D echo May 2021 showed hyperdynamic LV function with EF greater than 75% -After the episode of prolonged palpitations, she has been much stricter about caffeine, her major source is chocolate.  She does not do sodas or coffee -She is encouraged to severely limit caffeine and be sure she stays hydrated. -Last fall she had an episode of palpitations that had awakened her from sleep while at a conference.  She took 12.5mg  of Lopressor tartrate after her HR registered on her Kardia at 200bpm and after a while her HR settled down.   -she brought her [facility] device with her and the event is difficult to determine if an episode of PAF or atrial tachycardia - I suspect it is atrial tach -she is concerned that she is having PAF as both her parents have had PAF>>her CHADS2VASC score is 1 so no indication for anticoagulation -she tells me that her episodes mainly awaken her at night and again is concerned that she may have OSA and never got her sleep study done that was ordered by Neuro.   -she occasionally now  will feel palpitations that she thinks is her PVCs mainly 1-2 times a month when laying down -Continue prescription drug management with Toprol-XL 25 mg daily-this was refilled today -cannot increase BB further due to resting HR in the 60's -I reassured her that PVCs are benign -I would like to get a sleep study and treat any OSA to see if palpitations get better -if she does not have OSA then may need to consider addition of Flecainide -check BMET/TSH and Mag  2.  Excessive daytime sleepiness -She snores very bad and has excessive daytime sleepiness but is not sure it is related to undx OSA or from her BB -she has been told that she snores very loudly -She says that she feels she could take a nap everyday>she sleeps better sitting up and on the weekends will nap -she has gained 24lbs since COVID 19 -I will order a HST  Current medicines are reviewed at length with the patient today.  The patient does not have concerns regarding medicines.  Labs/ tests ordered today include:   Orders Placed This Encounter  Procedures   EKG 12-Lead    Disposition:   FU 3 months  Signed, Armanda Magic, MD  06/18/2021 8:40 AM    [facility] Phone: [phone]; Fax: [phone]

## 2021-06-22 ENCOUNTER — Telehealth: Payer: Self-pay | Admitting: Cardiology

## 2021-06-22 DIAGNOSIS — Z79899 Other long term (current) drug therapy: Secondary | ICD-10-CM

## 2021-06-22 NOTE — Telephone Encounter (Signed)
New Message:     Patient says she would like to talk to Dr Mayford Knife when she have time please. Have some questions about her Cholesterol and about the Lipitor.She saw Dr Mayford Knife on Thursday, but had not gotten her Cholesterol reults at that time.

## 2021-06-23 ENCOUNTER — Telehealth: Payer: Self-pay | Admitting: *Deleted

## 2021-06-23 NOTE — Telephone Encounter (Signed)
NO PA REQUIRED for HST.

## 2021-06-23 NOTE — Telephone Encounter (Signed)
Patient is returning call.  °

## 2021-06-23 NOTE — Telephone Encounter (Signed)
-----   Message from Gaynelle Cage, [place] sent at 06/22/2021  8:44 AM EDT -----  ----- Message ----- From: Theresia Majors, RN Sent: 06/18/2021   8:52 AM EDT To: Cv Div Sleep Studies  Home sleep study has been ordered.  Thanks!

## 2021-06-23 NOTE — Telephone Encounter (Signed)
Loretta Reichert, MD  06/18/2021 11:01 AM EDT     Normal CBC.  Normal TSH.  LDL goal < 100 and too high today.  Start Lipitor 20mg  daily and repeat FLP and ALT in 6 weeks.  TBili mildly elevated and c/w with known Gilbert's syndrome    Patient Communication      I attempted to reach patient, voicemail full.    Patient returned call. We discussed lab results. She will start Atorvastatin 20 mg qd and repeat lipids/alt in 6 weeks. I also encouraged her to use the DASH diet or mediterranean and exercise 150 minutes a week.

## 2021-06-24 ENCOUNTER — Other Ambulatory Visit (HOSPITAL_COMMUNITY): Payer: Self-pay

## 2021-06-24 ENCOUNTER — Telehealth: Payer: Self-pay | Admitting: Cardiology

## 2021-06-24 MED ORDER — METOPROLOL TARTRATE 25 MG PO TABS
12.5000 mg | ORAL_TABLET | Freq: Every day | ORAL | 3 refills | Status: DC | PRN
  Filled 2021-06-24: qty 30, 30d supply, fill #0

## 2021-06-24 NOTE — Telephone Encounter (Signed)
*  STAT* If patient is at the pharmacy, call can be transferred to refill team.   1. Which medications need to be refilled? (please list name of each medication and dose if known) metoprolol tartrate (LOPRESSOR) 25 MG tablet  2. Which pharmacy/location (including street and city if local pharmacy) is medication to be sent to? [facility]   3. Do they need a 30 day or 90 day supply 90

## 2021-06-24 NOTE — Telephone Encounter (Signed)
Medication refill request for Metoprolol Tartrate 25 mg tablets approved and sent to [facility] per pt's request. Original prescription sent to [facility] pharmacy.

## 2021-07-03 ENCOUNTER — Ambulatory Visit: Payer: 59 | Attending: Cardiology | Admitting: Cardiology

## 2021-07-03 ENCOUNTER — Other Ambulatory Visit: Payer: Self-pay

## 2021-07-03 DIAGNOSIS — G4719 Other hypersomnia: Secondary | ICD-10-CM | POA: Diagnosis not present

## 2021-07-03 DIAGNOSIS — R002 Palpitations: Secondary | ICD-10-CM | POA: Diagnosis not present

## 2021-07-03 DIAGNOSIS — G4733 Obstructive sleep apnea (adult) (pediatric): Secondary | ICD-10-CM

## 2021-07-03 NOTE — Telephone Encounter (Signed)
Informed patient of upcoming home sleep study and patient understanding was verbalized.  Patient understands her/his HST is scheduled for 07/06/21 7:30 at [facility] on home vm.

## 2021-07-06 ENCOUNTER — Encounter: Payer: 59 | Admitting: Cardiology

## 2021-07-15 NOTE — Procedures (Signed)
   Patient Name: Loretta Carpenter, Loretta Carpenter Date: 07/03/2021 Gender: Female D.O.B: [birth year] Age (years): 35 Referring Provider: Armanda Magic MD, ABSM Height (inches): 65 Interpreting Physician: Armanda Magic MD, ABSM Weight (lbs): 171 RPSGT: Alfonso Ellis BMI: 28 MRN: [id number]  CLINICAL INFORMATION Sleep Study Type: HST  Indication for sleep study: N/A  Epworth Sleepiness Score: N/A  SLEEP STUDY TECHNIQUE A multi-channel overnight portable sleep study was performed. The channels recorded were: nasal airflow, thoracic respiratory movement, and oxygen saturation with a pulse oximetry. Snoring was also monitored.  MEDICATIONS Patient self administered medications include: N/A.  SLEEP ARCHITECTURE Patient was studied for 512.5 minutes. The sleep efficiency was 93.5 % and the patient was supine for 31.3%. The arousal index was 0.0 per hour.  RESPIRATORY PARAMETERS The overall AHI was 20.0 per hour, with a central apnea index of 0 per hour.  The oxygen nadir was 80% during sleep.  CARDIAC DATA Mean heart rate during sleep was 55.4 bpm.  IMPRESSIONS - Moderate obstructive sleep apnea occurred during this study (AHI = 20.0/h). - Severe oxygen desaturation was noted during this study (Min O2 = 80%). - Patient snored 5.2% during the sleep.  DIAGNOSIS - Obstructive Sleep Apnea (G47.33)  RECOMMENDATIONS - Recommend ResMed auto CPAP from 4 to 15cm H2O with heated humidity and mask of choice. - Avoid alcohol, sedatives and other CNS depressants that may worsen sleep apnea and disrupt normal sleep architecture. - Sleep hygiene should be reviewed to assess factors that may improve sleep quality. - Weight management and regular exercise should be initiated or continued. - Return to Sleep Center in 6 weeks.   [Electronically signed] 07/15/2021 12:37 PM  Armanda Magic MD, ABSM Diplomate, American Board of Sleep Medicine

## 2021-07-16 ENCOUNTER — Telehealth: Payer: Self-pay | Admitting: *Deleted

## 2021-07-16 DIAGNOSIS — G4733 Obstructive sleep apnea (adult) (pediatric): Secondary | ICD-10-CM

## 2021-07-16 NOTE — Telephone Encounter (Signed)
-----   Message from Quintella Reichert, MD sent at 07/15/2021 12:40 PM EST ----- Please let patient know that they have sleep apnea and recommend treating with CPAP.  Please order an auto CPAP from 4-15cm H2O with heated humidity and mask of choice.  Order overnight pulse ox on CPAP.  Followup with me in 6 weeks.

## 2021-07-16 NOTE — Telephone Encounter (Signed)
The patient has been notified of the result and verbalized understanding.  All questions (if any) were answered. Loretta Carpenter, CMA 07/16/2021 1:19 PM    Upon patient request DME selection is [facility] Patient understands he will be contacted by [facility] to set up his cpap. Patient understands to call if [facility] does not contact him with new setup in a timely manner. Patient understands they will be called once confirmation has been received from [facility] that they have received their new machine to schedule 10 week follow up appointment. [facility] notified of new cpap order  Please add to airview Patient was grateful for the call and thanked me

## 2021-07-21 DIAGNOSIS — G4733 Obstructive sleep apnea (adult) (pediatric): Secondary | ICD-10-CM | POA: Diagnosis not present

## 2021-07-22 ENCOUNTER — Telehealth: Payer: Self-pay | Admitting: Cardiology

## 2021-07-22 ENCOUNTER — Other Ambulatory Visit (HOSPITAL_COMMUNITY): Payer: Self-pay

## 2021-07-22 MED ORDER — METOPROLOL SUCCINATE ER 25 MG PO TB24
25.0000 mg | ORAL_TABLET | Freq: Every day | ORAL | 3 refills | Status: DC
  Filled 2021-07-22: qty 90, 90d supply, fill #0
  Filled 2021-10-12: qty 90, 90d supply, fill #1
  Filled 2021-10-27: qty 90, 90d supply, fill #2

## 2021-07-22 NOTE — Telephone Encounter (Signed)
error 

## 2021-07-22 NOTE — Telephone Encounter (Signed)
This is a Loretta Carpenter pt.  °

## 2021-07-22 NOTE — Telephone Encounter (Signed)
Complete

## 2021-07-22 NOTE — Telephone Encounter (Signed)
  *  STAT* If patient is at the pharmacy, call can be transferred to refill team.   1. Which medications need to be refilled? (please list name of each medication and dose if known) metoprolol succinate (TOPROL-XL) 25 MG 24 hr tablet  2. Which pharmacy/location (including street and city if local pharmacy) is medication to be sent to? [facility]  3. Do they need a 30 day or 90 day supply? 90 days  Pt needs this meds send today so she can pick it up on Friday. [facility] got the prescription but its not covered by insurance, [facility]

## 2021-08-20 DIAGNOSIS — G4733 Obstructive sleep apnea (adult) (pediatric): Secondary | ICD-10-CM | POA: Diagnosis not present

## 2021-09-18 ENCOUNTER — Other Ambulatory Visit: Payer: Self-pay

## 2021-09-18 ENCOUNTER — Other Ambulatory Visit (HOSPITAL_COMMUNITY)
Admission: RE | Admit: 2021-09-18 | Discharge: 2021-09-18 | Disposition: A | Discharge status: Home / Self Care | Payer: 59 | Source: Ambulatory Visit | Attending: Cardiology | Admitting: Cardiology

## 2021-09-18 DIAGNOSIS — Z79899 Other long term (current) drug therapy: Secondary | ICD-10-CM | POA: Diagnosis not present

## 2021-09-18 DIAGNOSIS — Z13220 Encounter for screening for lipoid disorders: Secondary | ICD-10-CM

## 2021-09-18 LAB — LIPID PANEL
Cholesterol: 181 mg/dL (ref 0–200)
HDL: 41 mg/dL (ref >40)
LDL Cholesterol: 127 mg/dL — ABNORMAL HIGH (ref 0–99)
Total CHOL/HDL Ratio: 4.4 RATIO
Triglycerides: 67 mg/dL (ref <150)
VLDL: 13 mg/dL (ref 0–40)

## 2021-09-18 LAB — ALT: ALT: 23 U/L (ref 0–44)

## 2021-09-20 DIAGNOSIS — G4733 Obstructive sleep apnea (adult) (pediatric): Secondary | ICD-10-CM | POA: Diagnosis not present

## 2021-09-20 NOTE — Progress Notes (Signed)
Cardiology Office Note   Date:  09/21/2021   ID:  DAVAYAH PIGMAN, DOB 05-13-1961, MRN [id number]  PCP:  Carylon Perches, MD Cardiologist:  Armanda Magic, MD Electrphysiologist: None Armanda Magic, MD   Chief Complaint  Patient presents with   Follow-up    PVCs, OSA, Pulmonary HTN, HLD    History of Present Illness: Loretta Carpenter is a 61 y.o. female with a history of tachycardia/palpitations.  SHe has also been evaluated for OSA by Neuro.  When she was diagnosed w/ PVCs at age 55, she went off caffeine completely. After about 10 years, she started gradually adding back chocolate.   She was seen back by Cards after celebrating her birthday with friends and family who made her various chocolate deserts. After that is when she had the episode where she had prolonged palpitations. Event monitor showed an isolated episode of 4 beats of NSVT.  2D echo showed normal LVF with EF>75% with normal PAP.  She has been on Toprol XL for suppression.   Due to excessive daytime sleepiness and snoring she underwent a sleep study showing moderate OSA with an AHI of 20/hr and was placed on auto CPAP from 4-15cm H2o.    She is doing well with her CPAP device and thinks that she has gotten used to it.  She tolerates the mask and feels the pressure is adequate.  Since going on CPAP she feels rested in the am and has no significant daytime sleepiness.  She denies any significant mouth or nasal dryness or nasal congestion.  She does not think that he snores.     She is here today for followup and is doing well.  She denies any chest pain or pressure, SOB, DOE, PND, orthopnea, LE edema, dizziness, palpitations or syncope. She is compliant with her meds and is tolerating meds with no SE.     Past Medical History:  Diagnosis Date   Frequent PVCs    dx age 32   H/O renal calculi    HLD (hyperlipidemia)    Migraine    OSA on CPAP    moderate with AHI 20/hr now on CPAP   Palpitations 2021   short runs ventricular  and atrial ectopy on outpt monitor   Pulmonary HTN (HCC)    mild by echo 2022 and likely related to OSA    Past Surgical History:  Procedure Laterality Date   CESAREAN SECTION     x3   COLONOSCOPY N/A 07/29/2014   Procedure: COLONOSCOPY;  Surgeon: Malissa Hippo, MD;  [facility];  Service: Endoscopy;  Laterality: N/A;  730 - moved to 11/30 @ 7:30   LITHOTRIPSY     SALPINGOOPHORECTOMY Left     Current Outpatient Medications  Medication Sig Dispense Refill   acetaminophen (TYLENOL) 500 MG tablet Take 500 mg by mouth as needed for mild pain or moderate pain.      Ascorbic Acid (VITAMIN C PO) Take 1 tablet by mouth daily.     metoprolol succinate (TOPROL-XL) 25 MG 24 hr tablet Take 1 tablet (25 mg total) by mouth daily. 90 tablet 3   metoprolol tartrate (LOPRESSOR) 25 MG tablet Take 0.5-1 tablets (12.5-25 mg total) by mouth as needed for palpitations or heart rate greater than 150 bpm (sustained rate) 30 tablet 3   UNABLE TO FIND Take 1 tablet by mouth daily. Tumeric      VITAMIN D PO 5,000 IU by mouth daily     Zinc  220 (50 Zn) MG CAPS Take 1 capsule by mouth daily.     atorvastatin (LIPITOR) 20 MG tablet Take 1 tablet (20 mg total) by mouth daily. (Patient not taking: Reported on 09/21/2021) 90 tablet 3   No current facility-administered medications for this visit.    Allergies:   Penicillins    Social History:  The patient  reports that she has never smoked. She has never used smokeless tobacco. She reports that she does not drink alcohol and does not use drugs.   Family History:  The patient's family history includes Arthritis in her father and mother; Hypertension in her father.  She indicated that her mother is alive. She indicated that her father is alive.    ROS:  Please see the history of present illness. All other systems are reviewed and negative.    PHYSICAL EXAM: VS:  BP 134/66    Pulse 60    Ht 5\' 4"  (1.626 m)    Wt 150 lb 12.8 oz (68.4 kg)    SpO2  99%    BMI 25.88 kg/m  , BMI Body mass index is 25.88 kg/m. GEN: Well nourished, well developed in no acute distress HEENT: Normal NECK: No JVD; No carotid bruits LYMPHATICS: No lymphadenopathy CARDIAC:RRR, no murmurs, rubs, gallops RESPIRATORY:  Clear to auscultation without rales, wheezing or rhonchi  ABDOMEN: Soft, non-tender, non-distended MUSCULOSKELETAL:  No edema; No deformity  SKIN: Warm and dry NEUROLOGIC:  Alert and oriented x 3 PSYCHIATRIC:  Normal affect    EKG:  EKG is not ordered today  Echocardiogram 01/14/2020:    1. Left ventricular ejection fraction, by estimation, is >75%. The left  ventricle has hyperdynamic function. The left ventricle has no regional  wall motion abnormalities. There is mild left ventricular hypertrophy.  Left ventricular diastolic parameters  are indeterminate.   2. Right ventricular systolic function is normal. The right ventricular  size is normal. There is mildly elevated pulmonary artery systolic  pressure.   3. The mitral valve is normal in structure. No evidence of mitral valve  regurgitation. No evidence of mitral stenosis.   4. The aortic valve was not well visualized. Aortic valve regurgitation  is not visualized. No aortic stenosis is present.   5. The inferior vena cava is normal in size with greater than 50%  respiratory variability, suggesting right atrial pressure of 3 mmHg.   MONITOR: 02/21/2020 Predominantly sinus rhythm, avg HR 78 bpm. Isolated 4-beat run of non-sustained ventricular tachycardia. Very brief atrial run seen. No sustained arrhythmias. Some bradycardia, HR 48 during sleep    Recent Labs: 06/18/2021: BUN 20; Creatinine, Ser 0.69; Hemoglobin 15.8; Magnesium 2.4; Platelets 227; Potassium 4.1; Sodium 138; TSH 1.460 09/18/2021: ALT 23  CBC    Component Value Date/Time   WBC 5.3 06/18/2021 0910   RBC 5.43 (H) 06/18/2021 0910   HGB 15.8 (H) 06/18/2021 0910   HGB 14.8 08/27/2017 0941   HCT 49.7 (H)  06/18/2021 0910   HCT 45.9 08/27/2017 0941   PLT 227 06/18/2021 0910   PLT 224 08/27/2017 0941   MCV 91.5 06/18/2021 0910   MCV 90 08/27/2017 0941   MCH 29.1 06/18/2021 0910   MCHC 31.8 06/18/2021 0910   RDW 13.3 06/18/2021 0910   RDW 13.9 08/27/2017 0941   LYMPHSABS 1.6 08/27/2017 0941   MONOABS 0.4 03/14/2014 0416   EOSABS 0.3 08/27/2017 0941   BASOSABS 0.0 08/27/2017 0941   CMP Latest Ref Rng & Units 09/18/2021 06/18/2021 01/03/2020  Glucose 70 -  99 mg/dL - 95 96  BUN 6 - 20 mg/dL - 20 17  Creatinine 0.44 - 1.00 mg/dL - 0.69 0.55  Sodium 135 - 145 mmol/L - 138 138  Potassium 3.5 - 5.1 mmol/L - 4.1 4.0  Chloride 98 - 111 mmol/L - 104 105  CO2 22 - 32 mmol/L - 28 24  Calcium 8.9 - 10.3 mg/dL - 9.3 9.0  Total Protein 6.5 - 8.1 g/dL - 7.6 7.2  Total Bilirubin 0.3 - 1.2 mg/dL - 1.7(H) 1.6(H)  Alkaline Phos 38 - 126 U/L - 70 65  AST 15 - 41 U/L - 22 20  ALT 0 - 44 U/L 23 27 22    Magnesium  Date Value Ref Range Status  06/18/2021 2.4 1.7 - 2.4 mg/dL Final    Comment:    Performed at [facility], [address], [place], [place] [address]    Lipid Panel Lab Results  Component Value Date   CHOL 181 09/18/2021   HDL 41 09/18/2021   LDLCALC 127 (H) 09/18/2021   TRIG 67 09/18/2021   CHOLHDL 4.4 09/18/2021      Wt Readings from Last 3 Encounters:  09/21/21 150 lb 12.8 oz (68.4 kg)  06/18/21 171 lb 3.2 oz (77.7 kg)  03/21/20 156 lb (70.8 kg)     Other studies Reviewed: Additional studies/ records that were reviewed today include: Office notes, hospital records and testing.  ASSESSMENT AND PLAN:  1.  Palpitations/PVCs: -She has a hx of PVCs since age 63 -No critical arrhythmias on monitor.  Event monitor in May 2021 showed 1 isolated episode of 4 beats of ventricular tachycardia and she was started on Toprol-XL 25 mg daily -2D echo May 2021 showed hyperdynamic LV function with EF greater than 75% Her palpitations are fairly well controlled on Toprol -continue  prescription drug management with Toprol XL 25mg  daily with PRN refills -cannot increase BB further due to resting HR in the 60's  2.  OSA - The patient is tolerating PAP therapy well without any problems. The PAP download performed by his DME was personally reviewed and interpreted by me today and showed an AHI of 4.3/hr on auto CPAP cm H2O with 53% compliance in using more than 4 hours nightly.  The patient has been using and benefiting from PAP use and will continue to benefit from therapy.  -her compliance is down due to having COVID 19 in Dec -she also has problems with falling asleep and forgetting to place the mask on >>encouraged her to be more compliant with her device -also encouraged her to try to use a humidfier in the room she sleeps in to prevent running out of water in her water chamber -she is interested in the [employer] device but wants to wait on evaluation for now  3.  HLD -LDL goal is < 130 and ideally < 100 -HDL is good at 41 -she has lost 22lbs in the past few months -she is currently taking red yeast rice -I will check coronary Ca score and if elevated then will need to stop red yeast rice and stop statin  4.  Pulmonary HTN -mild by echo 12/2019 -likely related to OSA -will repeat echo 08/2022 once she gets accustomed to the PAP therapy    Current medicines are reviewed at length with the patient today.  The patient does not have concerns regarding medicines.  Labs/ tests ordered today include:   No orders of the defined types were placed in this encounter.  Disposition:   FU 3 months  Signed, Fransico Him, MD  09/21/2021 2:29 PM    [place] Phone: [phone]; Fax: [phone]

## 2021-09-21 ENCOUNTER — Encounter: Payer: Self-pay | Admitting: Cardiology

## 2021-09-21 ENCOUNTER — Other Ambulatory Visit: Payer: Self-pay

## 2021-09-21 ENCOUNTER — Ambulatory Visit: Payer: 59 | Admitting: Cardiology

## 2021-09-21 VITALS — BP 134/66 | HR 60 | Ht 64.0 in | Wt 150.8 lb

## 2021-09-21 DIAGNOSIS — E7800 Pure hypercholesterolemia, unspecified: Secondary | ICD-10-CM | POA: Diagnosis not present

## 2021-09-21 DIAGNOSIS — E785 Hyperlipidemia, unspecified: Secondary | ICD-10-CM | POA: Insufficient documentation

## 2021-09-21 DIAGNOSIS — G4733 Obstructive sleep apnea (adult) (pediatric): Secondary | ICD-10-CM

## 2021-09-21 DIAGNOSIS — I493 Ventricular premature depolarization: Secondary | ICD-10-CM | POA: Diagnosis not present

## 2021-09-21 DIAGNOSIS — I272 Pulmonary hypertension, unspecified: Secondary | ICD-10-CM | POA: Diagnosis not present

## 2021-09-21 DIAGNOSIS — Z9989 Dependence on other enabling machines and devices: Secondary | ICD-10-CM | POA: Diagnosis not present

## 2021-09-21 NOTE — Addendum Note (Signed)
Addended by: Kerney Elbe on: 09/21/2021 02:56 PM   Modules accepted: Orders

## 2021-09-21 NOTE — Patient Instructions (Addendum)
Medication Instructions:  Your physician recommends that you continue on your current medications as directed. Please refer to the Current Medication list given to you today.  *If you need a refill on your cardiac medications before your next appointment, please call your pharmacy*   If you have labs (blood work) drawn today and your tests are completely normal, you will receive your results only by: MyChart Message (if you have MyChart) OR A paper copy in the mail If you have any lab test that is abnormal or we need to change your treatment, we will call you to review the results.   Testing/Procedures: Calcium Score    Follow-Up: At [facility], you and your health needs are our priority.  As part of our continuing mission to provide you with exceptional heart care, we have created designated Provider Care Teams.  These Care Teams include your primary Cardiologist (physician) and Advanced Practice Providers (APPs -  Physician Assistants and Nurse Practitioners) who all work together to provide you with the care you need, when you need it.  We recommend signing up for the patient portal called "MyChart".  Sign up information is provided on this After Visit Summary.  MyChart is used to connect with patients for Virtual Visits (Telemedicine).  Patients are able to view lab/test results, encounter notes, upcoming appointments, etc.  Non-urgent messages can be sent to your provider as well.   To learn more about what you can do with MyChart, go to [email or web address].    Your next appointment:   4 month(s)  The format for your next appointment:   In Person  Provider:   You may see Armanda Magic, MD or one of the following Advanced Practice Providers on your designated Care Team:   Randall An, PA-C  Jacolyn Reedy, PA-C     Other Instructions Thank you for choosing [facility]!

## 2021-10-12 ENCOUNTER — Other Ambulatory Visit (HOSPITAL_COMMUNITY): Payer: Self-pay

## 2021-10-21 DIAGNOSIS — G4733 Obstructive sleep apnea (adult) (pediatric): Secondary | ICD-10-CM | POA: Diagnosis not present

## 2021-10-27 ENCOUNTER — Other Ambulatory Visit (HOSPITAL_COMMUNITY): Payer: Self-pay

## 2021-11-09 ENCOUNTER — Other Ambulatory Visit: Payer: Self-pay

## 2021-11-09 ENCOUNTER — Ambulatory Visit (HOSPITAL_COMMUNITY)
Admission: RE | Admit: 2021-11-09 | Discharge: 2021-11-09 | Disposition: A | Discharge status: Home / Self Care | Payer: 59 | Source: Ambulatory Visit | Attending: Cardiology | Admitting: Cardiology

## 2021-11-09 ENCOUNTER — Telehealth: Payer: Self-pay

## 2021-11-09 DIAGNOSIS — E7800 Pure hypercholesterolemia, unspecified: Secondary | ICD-10-CM | POA: Insufficient documentation

## 2021-11-09 NOTE — Telephone Encounter (Signed)
Patient notified and verbalized understanding. Pt had no questions or concerns at this time 

## 2022-01-01 ENCOUNTER — Ambulatory Visit: Payer: 59 | Admitting: Cardiology

## 2022-01-01 ENCOUNTER — Other Ambulatory Visit (HOSPITAL_COMMUNITY): Payer: Self-pay

## 2022-01-01 ENCOUNTER — Encounter: Payer: Self-pay | Admitting: Cardiology

## 2022-01-01 ENCOUNTER — Telehealth: Payer: Self-pay | Admitting: Cardiology

## 2022-01-01 VITALS — BP 118/70 | HR 64 | Ht 64.0 in | Wt 142.8 lb

## 2022-01-01 DIAGNOSIS — G4733 Obstructive sleep apnea (adult) (pediatric): Secondary | ICD-10-CM

## 2022-01-01 DIAGNOSIS — Z9989 Dependence on other enabling machines and devices: Secondary | ICD-10-CM

## 2022-01-01 DIAGNOSIS — I493 Ventricular premature depolarization: Secondary | ICD-10-CM

## 2022-01-01 DIAGNOSIS — I272 Pulmonary hypertension, unspecified: Secondary | ICD-10-CM

## 2022-01-01 DIAGNOSIS — E7800 Pure hypercholesterolemia, unspecified: Secondary | ICD-10-CM | POA: Diagnosis not present

## 2022-01-01 MED ORDER — METOPROLOL TARTRATE 25 MG PO TABS: 12.5000 mg | ORAL_TABLET | Freq: Every day | ORAL | 3 refills | Status: DC | PRN

## 2022-01-01 MED ORDER — METOPROLOL SUCCINATE ER 25 MG PO TB24
25.0000 mg | ORAL_TABLET | Freq: Every day | ORAL | 3 refills | Status: DC
  Filled 2022-01-01 – 2022-01-07 (×3): qty 90, 90d supply, fill #0
  Filled 2022-04-10: qty 90, 90d supply, fill #1
  Filled 2022-06-30 – 2022-07-06 (×3): qty 90, 90d supply, fill #2
  Filled 2022-10-04: qty 90, 90d supply, fill #3

## 2022-01-01 MED ORDER — METOPROLOL TARTRATE 25 MG PO TABS
12.5000 mg | ORAL_TABLET | Freq: Every day | ORAL | 3 refills | Status: DC | PRN
  Filled 2022-01-01 – 2022-01-07 (×3): qty 30, 30d supply, fill #0

## 2022-01-01 MED ORDER — METOPROLOL SUCCINATE ER 25 MG PO TB24: 25.0000 mg | ORAL_TABLET | Freq: Every day | ORAL | 3 refills | Status: DC

## 2022-01-01 NOTE — Telephone Encounter (Signed)
Completed.

## 2022-01-01 NOTE — Progress Notes (Addendum)
?  ?Cardiology Office Note ? ? ?Date:  01/01/2022  ? ?ID:  Loretta Carpenter, DOB 29-Mar-1961, MRN [id number] ? ?PCP:  Carylon PerchesFagan, Roy, MD ?Cardiologist:  Armanda Magicraci Kellan Raffield, MD ?Electrphysiologist: None ?Armanda Magicraci Mertis Mosher, MD  ? ?Chief Complaint  ?Patient presents with  ? Follow-up  ?  PVCs, OSA, hyperlipidemia, pulmonary hypertension  ? ? ?History of Present Illness: ?Loretta Carpenter is a 61 y.o. female with a history of tachycardia/palpitations.  SHe has also been evaluated for OSA by Neuro.  When she was diagnosed w/ PVCs at age 61, she went off caffeine completely. After about 10 years, she started gradually adding back chocolate.  ? ?She was seen back by Cards after celebrating her birthday with friends and family who made her various chocolate deserts. After that is when she had the episode where she had prolonged palpitations. Event monitor showed an isolated episode of 4 beats of NSVT.  2D echo showed normal LVF with EF>75% with normal PAP.  She has been on Toprol XL for suppression.  ? ?Due to excessive daytime sleepiness and snoring she underwent a sleep study showing moderate OSA with an AHI of 20/hr and was placed on auto CPAP from 4-15cm H2o.   ? ?She is doing well with her CPAP device and thinks that she has gotten used to it.  She tolerates the mask and feels the pressure is adequate.  Since going on CPAP she feels rested in the am and has no significant daytime sleepiness.  She denies any significant nasal dryness or nasal congestion.  She has some dry mouth with the FFM.  She has not adjusted the humidity in the device. She does not think that he snores.    ? ?She is here today for followup and is doing well.  She denies any chest pain or pressure, SOB, DOE (except when she walks up multiple flights of stairs), PND, orthopnea, LE edema, dizziness or syncope. Her palpitations are well controlled on BB and avoiding caffeine. She is compliant with her meds and is tolerating meds with no SE.    ? ?Past Medical History:   ?Diagnosis Date  ? Frequent PVCs   ? dx age 61  ? H/O renal calculi   ? HLD (hyperlipidemia)   ? Migraine   ? OSA on CPAP   ? moderate with AHI 20/hr now on CPAP  ? Palpitations 2021  ? short runs ventricular and atrial ectopy on outpt monitor  ? Pulmonary HTN (HCC)   ? mild by echo 2022 and likely related to OSA  ? ? ?Past Surgical History:  ?Procedure Laterality Date  ? CESAREAN SECTION    ? x3  ? COLONOSCOPY N/A 07/29/2014  ? Procedure: COLONOSCOPY;  Surgeon: Malissa HippoNajeeb U Rehman, MD;  [facility];  Service: Endoscopy;  Laterality: N/A;  730 - moved to 11/30 @ 7:30  ? LITHOTRIPSY    ? SALPINGOOPHORECTOMY Left   ? ? ?Current Outpatient Medications  ?Medication Sig Dispense Refill  ? acetaminophen (TYLENOL) 500 MG tablet Take 500 mg by mouth as needed for mild pain or moderate pain.     ? Ascorbic Acid (VITAMIN C PO) Take 1 tablet by mouth daily.    ? atorvastatin (LIPITOR) 20 MG tablet Take 1 tablet (20 mg total) by mouth daily. 90 tablet 3  ? metoprolol succinate (TOPROL-XL) 25 MG 24 hr tablet Take 1 tablet (25 mg total) by mouth daily. 90 tablet 3  ? metoprolol tartrate (LOPRESSOR) 25 MG tablet Take  0.5-1 tablets (12.5-25 mg total) by mouth as needed for palpitations or heart rate greater than 150 bpm (sustained rate) 30 tablet 3  ? UNABLE TO FIND Take 1 tablet by mouth daily. Tumeric     ? VITAMIN D PO 5,000 IU by mouth daily    ? Zinc 220 (50 Zn) MG CAPS Take 1 capsule by mouth daily.    ? ?No current facility-administered medications for this visit.  ? ? ?Allergies:   Penicillins  ? ? ?Social History:  The patient  reports that she has never smoked. She has never used smokeless tobacco. She reports that she does not drink alcohol and does not use drugs.  ? ?Family History:  The patient's family history includes Arthritis in her father and mother; Hypertension in her father.  ?She indicated that her mother is alive. She indicated that her father is alive. ? ? ? ?ROS:  Please see the history of present  illness. All other systems are reviewed and negative.  ? ? ?PHYSICAL EXAM: ?VS:  BP 118/70   Pulse 64   Ht 5\' 4"  (1.626 m)   Wt 142 lb 12.8 oz (64.8 kg)   SpO2 98%   BMI 24.51 kg/m?  , BMI Body mass index is 24.51 kg/m?. ?GEN: Well nourished, well developed in no acute distress ?HEENT: Normal ?NECK: No JVD; No carotid bruits ?LYMPHATICS: No lymphadenopathy ?CARDIAC:RRR, no murmurs, rubs, gallops ?RESPIRATORY:  Clear to auscultation without rales, wheezing or rhonchi  ?ABDOMEN: Soft, non-tender, non-distended ?MUSCULOSKELETAL:  No edema; No deformity  ?SKIN: Warm and dry ?NEUROLOGIC:  Alert and oriented x 3 ?PSYCHIATRIC:  Normal affect   ? ?EKG:  EKG is not ordered today ? ?Echocardiogram 01/14/2020: ?  ? 1. Left ventricular ejection fraction, by estimation, is >75%. The left  ?ventricle has hyperdynamic function. The left ventricle has no regional  ?wall motion abnormalities. There is mild left ventricular hypertrophy.  ?Left ventricular diastolic parameters  ?are indeterminate.  ? 2. Right ventricular systolic function is normal. The right ventricular  ?size is normal. There is mildly elevated pulmonary artery systolic  ?pressure.  ? 3. The mitral valve is normal in structure. No evidence of mitral valve  ?regurgitation. No evidence of mitral stenosis.  ? 4. The aortic valve was not well visualized. Aortic valve regurgitation  ?is not visualized. No aortic stenosis is present.  ? 5. The inferior vena cava is normal in size with greater than 50%  ?respiratory variability, suggesting right atrial pressure of 3 mmHg.  ? ?MONITOR: 02/21/2020 ?Predominantly sinus rhythm, avg HR 78 bpm. Isolated 4-beat run of non-sustained ventricular tachycardia. Very brief atrial run seen. No sustained arrhythmias. ?Some bradycardia, HR 48 during sleep ? ? ? ?Recent Labs: ?06/18/2021: BUN 20; Creatinine, Ser 0.69; Hemoglobin 15.8; Magnesium 2.4; Platelets 227; Potassium 4.1; Sodium 138; TSH 1.460 ?09/18/2021: ALT 23  ?CBC ?    ?Component Value Date/Time  ? WBC 5.3 06/18/2021 0910  ? RBC 5.43 (H) 06/18/2021 0910  ? HGB 15.8 (H) 06/18/2021 0910  ? HGB 14.8 08/27/2017 0941  ? HCT 49.7 (H) 06/18/2021 0910  ? HCT 45.9 08/27/2017 0941  ? PLT 227 06/18/2021 0910  ? PLT 224 08/27/2017 0941  ? MCV 91.5 06/18/2021 0910  ? MCV 90 08/27/2017 0941  ? MCH 29.1 06/18/2021 0910  ? MCHC 31.8 06/18/2021 0910  ? RDW 13.3 06/18/2021 0910  ? RDW 13.9 08/27/2017 0941  ? LYMPHSABS 1.6 08/27/2017 0941  ? MONOABS 0.4 03/14/2014 0416  ? EOSABS 0.3  08/27/2017 0941  ? BASOSABS 0.0 08/27/2017 0941  ? ? ?  Latest Ref Rng & Units 09/18/2021  ?  9:28 AM 06/18/2021  ?  9:07 AM 01/03/2020  ?  8:17 AM  ?CMP  ?Glucose 70 - 99 mg/dL  95   96    ?BUN 6 - 20 mg/dL  20   17    ?Creatinine 0.44 - 1.00 mg/dL  3.30   0.76    ?Sodium 135 - 145 mmol/L  138   138    ?Potassium 3.5 - 5.1 mmol/L  4.1   4.0    ?Chloride 98 - 111 mmol/L  104   105    ?CO2 22 - 32 mmol/L  28   24    ?Calcium 8.9 - 10.3 mg/dL  9.3   9.0    ?Total Protein 6.5 - 8.1 g/dL  7.6   7.2    ?Total Bilirubin 0.3 - 1.2 mg/dL  1.7   1.6    ?Alkaline Phos 38 - 126 U/L  70   65    ?AST 15 - 41 U/L  22   20    ?ALT 0 - 44 U/L 23   27   22     ? ?Magnesium  ?Date Value Ref Range Status  ?06/18/2021 2.4 1.7 - 2.4 mg/dL Final  ?  Comment:  ?  Performed at [facility], [address], [place], [place]  ? ? ?Lipid Panel ?Lab Results  ?Component Value Date  ? CHOL 181 09/18/2021  ? HDL 41 09/18/2021  ? LDLCALC 127 (H) 09/18/2021  ? TRIG 67 09/18/2021  ? CHOLHDL 4.4 09/18/2021  ? ? ?  ?Wt Readings from Last 3 Encounters:  ?01/01/22 142 lb 12.8 oz (64.8 kg)  ?09/21/21 150 lb 12.8 oz (68.4 kg)  ?06/18/21 171 lb 3.2 oz (77.7 kg)  ?  ? ?Other studies Reviewed: ?Additional studies/ records that were reviewed today include: Office notes, hospital records and testing. ? ?ASSESSMENT AND PLAN: ? ?1.  Palpitations/PVCs: ?-She has a hx of PVCs since age 56 ?-No critical arrhythmias on monitor.  Event monitor in May 2021 showed 1  isolated episode of 4 beats of ventricular tachycardia and she was started on Toprol-XL 25 mg daily ?-2D echo May 2021 showed hyperdynamic LV function with EF greater than 75% ?-Palpitations are well controlled with beta-bl

## 2022-01-01 NOTE — Patient Instructions (Addendum)
Medication Instructions:  ?No changes ?*If you need a refill on your cardiac medications before your next appointment, please call your pharmacy* ? ? ?Lab Work: ?Fasting Lipid Panel/ALT- Please do not eat or drink at least 8 hours prior to lab work ?If you have labs (blood work) drawn today and your tests are completely normal, you will receive your results only by: ?MyChart Message (if you have MyChart) OR ?A paper copy in the mail ?If you have any lab test that is abnormal or we need to change your treatment, we will call you to review the results. ? ? ?Testing/Procedures: ?None ? ? ?Follow-Up: ?At [facility], you and your health needs are our priority.  As part of our continuing mission to provide you with exceptional heart care, we have created designated Provider Care Teams.  These Care Teams include your primary Cardiologist (physician) and Advanced Practice Providers (APPs -  Physician Assistants and Nurse Practitioners) who all work together to provide you with the care you need, when you need it. ? ?We recommend signing up for the patient portal called "MyChart".  Sign up information is provided on this After Visit Summary.  MyChart is used to connect with patients for Virtual Visits (Telemedicine).  Patients are able to view lab/test results, encounter notes, upcoming appointments, etc.  Non-urgent messages can be sent to your provider as well.   ?To learn more about what you can do with MyChart, go to [email or web address].   ? ?Your next appointment:   ?1 year(s) ? ?The format for your next appointment:   ?In Person ? ?Provider:   ?Fransico Him, MD ? ?Important Information About Sugar ? ? ? ? ? ? ?

## 2022-01-01 NOTE — Addendum Note (Signed)
Addended by: Leonides Schanz C on: 01/01/2022 02:44 PM ? ? Modules accepted: Orders ? ?

## 2022-01-01 NOTE — Telephone Encounter (Signed)
She was just in today. She would like for her two prescriptions to be sent to [place]. ?

## 2022-01-01 NOTE — Addendum Note (Signed)
Addended by: Leonides Schanz C on: 01/01/2022 02:42 PM ? ? Modules accepted: Orders ? ?

## 2022-01-05 ENCOUNTER — Other Ambulatory Visit (HOSPITAL_COMMUNITY): Payer: Self-pay

## 2022-01-07 ENCOUNTER — Other Ambulatory Visit (HOSPITAL_COMMUNITY): Payer: Self-pay

## 2022-02-10 ENCOUNTER — Ambulatory Visit: Payer: 59 | Admitting: Cardiology

## 2022-04-12 ENCOUNTER — Other Ambulatory Visit (HOSPITAL_COMMUNITY): Payer: Self-pay

## 2022-05-11 DIAGNOSIS — G4733 Obstructive sleep apnea (adult) (pediatric): Secondary | ICD-10-CM | POA: Diagnosis not present

## 2022-06-28 ENCOUNTER — Other Ambulatory Visit (HOSPITAL_COMMUNITY): Payer: Self-pay | Admitting: Internal Medicine

## 2022-06-28 DIAGNOSIS — Z1231 Encounter for screening mammogram for malignant neoplasm of breast: Secondary | ICD-10-CM

## 2022-06-30 ENCOUNTER — Other Ambulatory Visit (HOSPITAL_COMMUNITY): Payer: Self-pay

## 2022-07-06 ENCOUNTER — Other Ambulatory Visit (HOSPITAL_COMMUNITY): Payer: Self-pay

## 2022-07-12 ENCOUNTER — Ambulatory Visit (HOSPITAL_COMMUNITY)
Admission: RE | Admit: 2022-07-12 | Discharge: 2022-07-12 | Disposition: A | Discharge status: Home / Self Care | Payer: 59 | Source: Ambulatory Visit | Attending: Internal Medicine | Admitting: Internal Medicine

## 2022-07-12 DIAGNOSIS — Z1231 Encounter for screening mammogram for malignant neoplasm of breast: Secondary | ICD-10-CM | POA: Diagnosis not present

## 2022-09-16 DIAGNOSIS — Z0001 Encounter for general adult medical examination with abnormal findings: Secondary | ICD-10-CM | POA: Diagnosis not present

## 2022-09-20 ENCOUNTER — Other Ambulatory Visit (HOSPITAL_COMMUNITY): Payer: Self-pay

## 2022-09-20 DIAGNOSIS — G4733 Obstructive sleep apnea (adult) (pediatric): Secondary | ICD-10-CM | POA: Diagnosis not present

## 2022-09-20 DIAGNOSIS — I493 Ventricular premature depolarization: Secondary | ICD-10-CM | POA: Diagnosis not present

## 2022-09-20 DIAGNOSIS — E785 Hyperlipidemia, unspecified: Secondary | ICD-10-CM | POA: Diagnosis not present

## 2022-09-20 DIAGNOSIS — G43909 Migraine, unspecified, not intractable, without status migrainosus: Secondary | ICD-10-CM | POA: Diagnosis not present

## 2022-09-20 MED ORDER — ESCITALOPRAM OXALATE 10 MG PO TABS
10.0000 mg | ORAL_TABLET | Freq: Every day | ORAL | 3 refills | Status: AC
Start: 2022-09-20 — End: ?
  Filled 2022-09-20: qty 30, 30d supply, fill #0
  Filled 2022-11-23: qty 30, 30d supply, fill #1
  Filled 2023-01-10: qty 30, 30d supply, fill #0
  Filled 2023-02-15: qty 30, 30d supply, fill #1
  Filled 2023-08-07: qty 30, 30d supply, fill #2

## 2022-09-20 MED ORDER — PROMETHAZINE HCL 25 MG PO TABS
25.0000 mg | ORAL_TABLET | Freq: Four times a day (QID) | ORAL | 1 refills | Status: AC
  Filled 2022-09-20: qty 20, 5d supply, fill #0

## 2022-09-21 ENCOUNTER — Other Ambulatory Visit (HOSPITAL_COMMUNITY): Payer: Self-pay

## 2022-10-04 ENCOUNTER — Other Ambulatory Visit (HOSPITAL_COMMUNITY): Payer: Self-pay

## 2022-10-19 ENCOUNTER — Other Ambulatory Visit (HOSPITAL_COMMUNITY): Payer: Self-pay

## 2022-10-29 ENCOUNTER — Other Ambulatory Visit (HOSPITAL_COMMUNITY): Payer: Self-pay

## 2022-11-23 ENCOUNTER — Other Ambulatory Visit (HOSPITAL_COMMUNITY): Payer: Self-pay

## 2022-11-24 ENCOUNTER — Other Ambulatory Visit (HOSPITAL_COMMUNITY): Payer: Self-pay

## 2022-11-24 ENCOUNTER — Other Ambulatory Visit: Payer: Self-pay

## 2022-11-25 DIAGNOSIS — R454 Irritability and anger: Secondary | ICD-10-CM | POA: Diagnosis not present

## 2022-11-25 DIAGNOSIS — G43909 Migraine, unspecified, not intractable, without status migrainosus: Secondary | ICD-10-CM | POA: Diagnosis not present

## 2022-12-10 ENCOUNTER — Other Ambulatory Visit (HOSPITAL_COMMUNITY): Payer: Self-pay

## 2023-01-10 ENCOUNTER — Other Ambulatory Visit: Payer: Self-pay

## 2023-01-10 ENCOUNTER — Other Ambulatory Visit (HOSPITAL_COMMUNITY): Payer: Self-pay

## 2023-01-10 ENCOUNTER — Other Ambulatory Visit: Payer: Self-pay | Admitting: Cardiology

## 2023-01-10 MED ORDER — METOPROLOL SUCCINATE ER 25 MG PO TB24
25.0000 mg | ORAL_TABLET | Freq: Every day | ORAL | 0 refills | Status: DC
  Filled 2023-01-10: qty 30, 30d supply, fill #0

## 2023-02-01 ENCOUNTER — Other Ambulatory Visit (HOSPITAL_COMMUNITY): Payer: Self-pay

## 2023-02-01 DIAGNOSIS — R454 Irritability and anger: Secondary | ICD-10-CM | POA: Diagnosis not present

## 2023-02-01 MED ORDER — PROMETHAZINE HCL 25 MG RE SUPP
25.0000 mg | RECTAL | 2 refills | Status: AC
  Filled 2023-02-01 – 2023-03-24 (×2): qty 12, 2d supply, fill #0
  Filled 2023-07-08: qty 12, 2d supply, fill #1

## 2023-02-01 MED ORDER — ESCITALOPRAM OXALATE 10 MG PO TABS
10.0000 mg | ORAL_TABLET | Freq: Every day | ORAL | 4 refills | Status: DC
  Filled 2023-02-01 – 2023-03-24 (×2): qty 90, 90d supply, fill #0

## 2023-02-15 ENCOUNTER — Other Ambulatory Visit (HOSPITAL_COMMUNITY): Payer: Self-pay

## 2023-03-08 ENCOUNTER — Other Ambulatory Visit (HOSPITAL_COMMUNITY): Payer: Self-pay

## 2023-03-09 ENCOUNTER — Telehealth: Payer: Self-pay | Admitting: Cardiology

## 2023-03-09 MED ORDER — METOPROLOL SUCCINATE ER 25 MG PO TB24: 25.0000 mg | ORAL_TABLET | Freq: Every day | ORAL | 0 refills | Status: DC

## 2023-03-09 NOTE — Telephone Encounter (Signed)
Pt came in office and stated that her metoprolol succinate (TOPROL-XL) 25 MG 24 hr tablet  is out of refills. She uses [facility]. Pt is aware she is due for FU, she is wanting me to reach out to Dr. Wyline Mood and see if he will take her on as a PT because she was previously w/Dr. Mayford Knife. She is comfortable with Dr. Wyline Mood because she knows him as a doctor.  [phone] is the best number to reach her.

## 2023-03-09 NOTE — Telephone Encounter (Signed)
Pt came in office and stated that her metoprolol succinate (TOPROL-XL) 25 MG 24 hr tablet  is out of refills. She uses [facility]. Pt is aware she is due for FU, she is wanting me to reach out to Dr. Wyline Mood and see if he will take her on as a PT because she was previously w/Dr. Mayford Knife. She is comfortable with Dr. Wyline Mood because she  [phone] is the best number to reach her.

## 2023-03-09 NOTE — Telephone Encounter (Signed)
Note sent to both providers requesting provider change and refill complete.

## 2023-03-24 ENCOUNTER — Other Ambulatory Visit: Payer: Self-pay

## 2023-03-24 ENCOUNTER — Other Ambulatory Visit (HOSPITAL_COMMUNITY): Payer: Self-pay

## 2023-03-24 ENCOUNTER — Telehealth: Payer: Self-pay | Admitting: Nurse Practitioner

## 2023-03-24 MED ORDER — METOPROLOL SUCCINATE ER 25 MG PO TB24
25.0000 mg | ORAL_TABLET | Freq: Every day | ORAL | 0 refills | Status: DC
  Filled 2023-03-24: qty 15, 15d supply, fill #0

## 2023-03-24 NOTE — Telephone Encounter (Signed)
*  STAT* If patient is at the pharmacy, call can be transferred to refill team.   1. Which medications need to be refilled? (please list name of each medication and dose if known) Metoprolol Succinate    2. Would you like to learn more about the convenience, safety, & potential cost savings by using the [facility]?      3. Are you open to using the [facility] (Type [facility].   4. Which pharmacy/location (including street and city if local pharmacy) is medication to be sent to?[facility]   5. Do they need a 30 day or 90 day supply?  Enough until her appointment  05-10-23

## 2023-03-24 NOTE — Telephone Encounter (Signed)
Sent to [facility]

## 2023-03-28 NOTE — Telephone Encounter (Signed)
*  STAT* If patient is at the pharmacy, call can be transferred to refill team.   1. Which medications need to be refilled? (please list name of each medication and dose if known)   metoprolol succinate (TOPROL-XL) 25 MG 24 hr tablet   2. Would you like to learn more about the convenience, safety, & potential cost savings by using the [facility]?   3. Are you open to using the [facility] (Type [facility]. ).  4. Which pharmacy/location (including street and city if local pharmacy) is medication to be sent to?  [place] - [facility]   5. Do they need a 30 day or 90 day supply?  90 days  Patient stated that her medication was only refilled for 15 day and she will need enough medication to last through her scheduled appointment on 9/10.

## 2023-03-31 ENCOUNTER — Ambulatory Visit: Payer: Commercial Managed Care - PPO | Admitting: Nurse Practitioner

## 2023-04-13 ENCOUNTER — Other Ambulatory Visit (HOSPITAL_COMMUNITY): Payer: Self-pay

## 2023-04-13 ENCOUNTER — Other Ambulatory Visit: Payer: Self-pay | Admitting: Nurse Practitioner

## 2023-04-13 ENCOUNTER — Telehealth: Payer: Self-pay | Admitting: Nurse Practitioner

## 2023-04-13 MED ORDER — METOPROLOL SUCCINATE ER 25 MG PO TB24
25.0000 mg | ORAL_TABLET | Freq: Every day | ORAL | 0 refills | Status: DC
  Filled 2023-04-13: qty 30, 30d supply, fill #0

## 2023-04-13 NOTE — Telephone Encounter (Signed)
Refill sent to pharmacy.   

## 2023-04-13 NOTE — Telephone Encounter (Signed)
*  STAT* If patient is at the pharmacy, call can be transferred to refill team.   1. Which medications need to be refilled? (please list name of each medication and dose if known)   metoprolol succinate (TOPROL-XL) 25 MG 24 hr tablet    2. Which pharmacy/location (including street and city if local pharmacy) is medication to be sent to?  [place] - [facility]      3. Do they need a 30 day or 90 day supply? 30 day     Pt is completely out of medication and has an office visit scheduled for 05/10/2023.

## 2023-05-10 ENCOUNTER — Other Ambulatory Visit (HOSPITAL_COMMUNITY): Payer: Self-pay

## 2023-05-10 ENCOUNTER — Ambulatory Visit: Payer: Commercial Managed Care - PPO | Attending: Nurse Practitioner | Admitting: Nurse Practitioner

## 2023-05-10 ENCOUNTER — Encounter: Payer: Self-pay | Admitting: Nurse Practitioner

## 2023-05-10 VITALS — BP 110/60 | HR 57 | Ht 64.0 in | Wt 158.0 lb

## 2023-05-10 DIAGNOSIS — G43909 Migraine, unspecified, not intractable, without status migrainosus: Secondary | ICD-10-CM | POA: Diagnosis not present

## 2023-05-10 DIAGNOSIS — E785 Hyperlipidemia, unspecified: Secondary | ICD-10-CM

## 2023-05-10 DIAGNOSIS — R002 Palpitations: Secondary | ICD-10-CM

## 2023-05-10 DIAGNOSIS — Z13220 Encounter for screening for lipoid disorders: Secondary | ICD-10-CM

## 2023-05-10 DIAGNOSIS — R454 Irritability and anger: Secondary | ICD-10-CM | POA: Diagnosis not present

## 2023-05-10 DIAGNOSIS — I493 Ventricular premature depolarization: Secondary | ICD-10-CM

## 2023-05-10 DIAGNOSIS — I272 Pulmonary hypertension, unspecified: Secondary | ICD-10-CM | POA: Diagnosis not present

## 2023-05-10 DIAGNOSIS — G4733 Obstructive sleep apnea (adult) (pediatric): Secondary | ICD-10-CM | POA: Diagnosis not present

## 2023-05-10 MED ORDER — METOPROLOL TARTRATE 25 MG PO TABS
12.5000 mg | ORAL_TABLET | Freq: Every day | ORAL | 11 refills | Status: DC | PRN
  Filled 2023-05-10: qty 30, 30d supply, fill #0

## 2023-05-10 MED ORDER — ESCITALOPRAM OXALATE 10 MG PO TABS
10.0000 mg | ORAL_TABLET | Freq: Every day | ORAL | 4 refills | Status: DC
  Filled 2023-05-10: qty 90, 90d supply, fill #0

## 2023-05-10 MED ORDER — METOPROLOL SUCCINATE ER 25 MG PO TB24
25.0000 mg | ORAL_TABLET | Freq: Every day | ORAL | 11 refills | Status: DC
  Filled 2023-05-10: qty 30, 30d supply, fill #0
  Filled 2023-06-10: qty 30, 30d supply, fill #1
  Filled 2023-07-08: qty 30, 30d supply, fill #2
  Filled 2023-08-07: qty 30, 30d supply, fill #3
  Filled 2023-10-02: qty 30, 30d supply, fill #4
  Filled 2023-10-28: qty 30, 30d supply, fill #5
  Filled 2023-12-20: qty 30, 30d supply, fill #6

## 2023-05-10 NOTE — Progress Notes (Signed)
Cardiology Office Note:  .   Date:  05/10/2023 ID:  Loretta Carpenter, DOB Aug 18, 1961, MRN [id number] PCP: Loretta Perches, MD  [facility] Providers Cardiologist:  Loretta Magic, MD    History of Present Illness: .   Loretta Carpenter is a 62 y.o. female with a PMH of PVC's, HLD, OSA, and mild pulmonary HTN, who presents today for scheduled follow-up.   Diagnosed with PVCs at age 10.  Later on noticed prolonged palpitations after eating chocolate desserts.  Event monitor revealed isolated episode of 4 beats of NSVT.  Echocardiogram revealed normal EF, normal PAP.  Was on Toprol-XL for suppression.  Last seen by Dr. Armanda Carpenter on Jan 01, 2022.  She was doing well at the time.  She was avoiding caffeine.  Today she presents for scheduled follow-up.  She states she is doing well. Does admit to palpitations if feeling stressed. Denies any recent chest pain, shortness of breath, palpitations, syncope, presyncope, dizziness, orthopnea, PND, swelling or significant weight changes, acute bleeding, or claudication. Working on changing her diet and exercising regimen.   ROS: See HPI - rest of ROS: negative.   Studies Reviewed: [name]    EKG: EKG Interpretation Date/Time:  Tuesday May 10 2023 15:39:08 EDT Ventricular Rate:  57 PR Interval:  142 QRS Duration:  72 QT Interval:  424 QTC Calculation: 412 R Axis:   76  Text Interpretation: Sinus bradycardia No previous ECGs available Confirmed by Sharlene Dory [phone]) on 05/10/2023 4:00:16 PM   CT Cardiac Scoring 10/2021: Coronary calcium score of 0.  No acute findings in the imaged extracardiac chest.  Monitor 01/2020: Predominantly sinus rhythm, avg HR 78 bpm. Isolated 4-beat run of non-sustained ventricular tachycardia. Very brief atrial run seen. No sustained arrhythmias.  Echo 12/2019: 1. Left ventricular ejection fraction, by estimation, is >75%. The left  ventricle has hyperdynamic function. The left ventricle has no regional  wall motion  abnormalities. There is mild left ventricular hypertrophy.  Left ventricular diastolic parameters  are indeterminate.   2. Right ventricular systolic function is normal. The right ventricular  size is normal. There is mildly elevated pulmonary artery systolic  pressure.   3. The mitral valve is normal in structure. No evidence of mitral valve  regurgitation. No evidence of mitral stenosis.   4. The aortic valve was not well visualized. Aortic valve regurgitation  is not visualized. No aortic stenosis is present.   5. The inferior vena cava is normal in size with greater than 50%  respiratory variability, suggesting right atrial pressure of 3 mmHg. Risk Assessment/Calculations:   The 10-year ASCVD risk score (Arnett DK, et al., 2019) is: 2.8%   Values used to calculate the score:     Age: 89 years     Sex: Female     Is Non-Hispanic African American: No     Diabetic: No     Tobacco smoker: No     Systolic Blood Pressure: 110 mmHg     Is BP treated: No     HDL Cholesterol: 41 mg/dL     Total Cholesterol: 181 mg/dL      Physical Exam:   VS:  BP 110/60   Pulse (!) 57   Ht 5\' 4"  (1.626 m)   Wt 158 lb (71.7 kg)   BMI 27.12 kg/m    Wt Readings from Last 3 Encounters:  05/10/23 158 lb (71.7 kg)  01/01/22 142 lb 12.8 oz (64.8 kg)  09/21/21 150 lb 12.8 oz (68.4 kg)  GEN: Well nourished, well developed in no acute distress NECK: No JVD; No carotid bruits CARDIAC: S1/S2, RRR, no murmurs, rubs, gallops RESPIRATORY:  Clear to auscultation without rales, wheezing or rhonchi  ABDOMEN: Soft, non-tender, non-distended EXTREMITIES:  No edema; No deformity   ASSESSMENT AND PLAN: .    Palpitations, PVC's Denies any recent palpitations.  Doing well on medication regimen.  Loretta provide refills for her.  No medication changes at this time.  Heart healthy diet and regular cardiovascular exercise encouraged.   HLD LDL from 08/2022 was 130.  ASCVD risk score at this time does not indicate  that she requires statin use.  CT cardiac scoring showed coronary calcium score of 0.  She states that she Loretta be returning to her red yeast rice regimen and changing her exercise regimen and changing her diet. Heart healthy diet and regular cardiovascular exercise encouraged. Loretta repeat FLP/LFT in 3 months per her request. Continue to follow with PCP.  OSA, pulmonary HTN Denies any issues. Encouraged continued compliance with CPAP.  Echocardiogram in 2021 revealed mild pulmonary hypertension, most likely related to history of OSA.  Dispo: Follow-up in 1 year with Dr. Adonis Carpenter or sooner if anything changes.   Signed, Sharlene Dory, NP

## 2023-05-10 NOTE — Patient Instructions (Signed)
Medication Instructions:  Your physician recommends that you continue on your current medications as directed. Please refer to the Current Medication list given to you today.  Labwork: FLP, LFT in 3 Months   Testing/Procedures: None  Follow-Up: Your physician recommends that you schedule a follow-up appointment in: 1 Year   Any Other Special Instructions Will Be Listed Below (If Applicable).  If you need a refill on your cardiac medications before your next appointment, please call your pharmacy.

## 2023-05-11 ENCOUNTER — Other Ambulatory Visit: Payer: Self-pay

## 2023-06-10 ENCOUNTER — Telehealth: Payer: Self-pay | Admitting: Cardiology

## 2023-06-10 ENCOUNTER — Other Ambulatory Visit (HOSPITAL_COMMUNITY): Payer: Self-pay

## 2023-06-10 NOTE — Telephone Encounter (Signed)
*  STAT* If patient is at the pharmacy, call can be transferred to refill team.   1. Which medications need to be refilled? (please list name of each medication and dose if known)   metoprolol succinate (TOPROL XL) 25 MG 24 hr tablet    2. Which pharmacy/location (including street and city if local pharmacy) is medication to be sent to? [place] [facility]    3. Do they need a 30 day or 90 day supply? 90 day

## 2023-06-10 NOTE — Telephone Encounter (Signed)
Called to verify that refills was available for patient. She stated that it was and will have prepared to send out to her on Monday.

## 2023-06-22 ENCOUNTER — Other Ambulatory Visit (HOSPITAL_COMMUNITY): Payer: Self-pay | Admitting: Internal Medicine

## 2023-06-22 DIAGNOSIS — Z1231 Encounter for screening mammogram for malignant neoplasm of breast: Secondary | ICD-10-CM

## 2023-07-08 ENCOUNTER — Other Ambulatory Visit: Payer: Self-pay

## 2023-07-08 ENCOUNTER — Other Ambulatory Visit (HOSPITAL_COMMUNITY): Payer: Self-pay

## 2023-07-11 ENCOUNTER — Other Ambulatory Visit (HOSPITAL_COMMUNITY): Payer: Self-pay

## 2023-07-11 MED ORDER — ESCITALOPRAM OXALATE 10 MG PO TABS
10.0000 mg | ORAL_TABLET | Freq: Every day | ORAL | 0 refills | Status: DC
  Filled 2023-07-11: qty 90, 90d supply, fill #0

## 2023-07-15 DIAGNOSIS — H524 Presbyopia: Secondary | ICD-10-CM | POA: Diagnosis not present

## 2023-07-15 DIAGNOSIS — H52223 Regular astigmatism, bilateral: Secondary | ICD-10-CM | POA: Diagnosis not present

## 2023-07-15 DIAGNOSIS — H5213 Myopia, bilateral: Secondary | ICD-10-CM | POA: Diagnosis not present

## 2023-07-25 ENCOUNTER — Ambulatory Visit (HOSPITAL_COMMUNITY)
Admission: RE | Admit: 2023-07-25 | Discharge: 2023-07-25 | Disposition: A | Discharge status: Home / Self Care | Payer: Commercial Managed Care - PPO | Source: Ambulatory Visit | Attending: Internal Medicine | Admitting: Internal Medicine

## 2023-07-25 DIAGNOSIS — Z1231 Encounter for screening mammogram for malignant neoplasm of breast: Secondary | ICD-10-CM | POA: Insufficient documentation

## 2023-08-08 ENCOUNTER — Other Ambulatory Visit: Payer: Self-pay

## 2023-10-02 ENCOUNTER — Other Ambulatory Visit (HOSPITAL_COMMUNITY): Payer: Self-pay

## 2023-10-03 ENCOUNTER — Other Ambulatory Visit: Payer: Self-pay

## 2023-10-03 ENCOUNTER — Other Ambulatory Visit (HOSPITAL_COMMUNITY): Payer: Self-pay

## 2023-10-03 MED ORDER — ESCITALOPRAM OXALATE 10 MG PO TABS
10.0000 mg | ORAL_TABLET | Freq: Every day | ORAL | 4 refills | Status: DC
  Filled 2023-10-03: qty 90, 90d supply, fill #0
  Filled 2024-01-02: qty 90, 90d supply, fill #1
  Filled 2024-04-09: qty 90, 90d supply, fill #2
  Filled 2024-04-12: qty 90, 90d supply, fill #0
  Filled 2024-07-06 – 2024-07-25 (×2): qty 90, 90d supply, fill #1

## 2023-10-28 ENCOUNTER — Other Ambulatory Visit: Payer: Self-pay

## 2023-12-20 ENCOUNTER — Telehealth: Payer: Self-pay | Admitting: Nurse Practitioner

## 2023-12-20 ENCOUNTER — Other Ambulatory Visit: Payer: Self-pay

## 2023-12-20 ENCOUNTER — Other Ambulatory Visit (HOSPITAL_COMMUNITY): Payer: Self-pay

## 2023-12-20 MED ORDER — METOPROLOL SUCCINATE ER 25 MG PO TB24: 25.0000 mg | ORAL_TABLET | Freq: Every day | ORAL | 0 refills | Status: DC

## 2023-12-20 NOTE — Telephone Encounter (Signed)
 Pt came in office today and stated that she is getting her Metoprolol  Succinate refilled through the [facility] pharmacy and it will be a day before she will get them. She is wanting to know if we could call in like 5 pills to [facility] so she will have some to hold her over.

## 2023-12-20 NOTE — Telephone Encounter (Signed)
Refill complete to [facility]

## 2023-12-22 ENCOUNTER — Other Ambulatory Visit (HOSPITAL_COMMUNITY): Payer: Self-pay

## 2023-12-22 ENCOUNTER — Other Ambulatory Visit: Payer: Self-pay

## 2023-12-22 ENCOUNTER — Other Ambulatory Visit: Payer: Self-pay | Admitting: Cardiology

## 2023-12-22 MED ORDER — METOPROLOL SUCCINATE ER 25 MG PO TB24
25.0000 mg | ORAL_TABLET | Freq: Every day | ORAL | 3 refills | Status: DC
  Filled 2023-12-22: qty 90, 90d supply, fill #0
  Filled 2024-04-09: qty 90, 90d supply, fill #1
  Filled 2024-04-12: qty 90, 90d supply, fill #0
  Filled 2024-07-06 – 2024-07-25 (×2): qty 90, 90d supply, fill #1

## 2024-01-02 ENCOUNTER — Other Ambulatory Visit (HOSPITAL_COMMUNITY): Payer: Self-pay

## 2024-01-07 IMAGING — CT CT CARDIAC CORONARY ARTERY CALCIUM SCORE
2 of 3 series · 8 of 20 positions shown, 10 images · non-contrast
Comparison: None.
COMPARISON: None.

Addendum:
EXAM:
OVER-READ INTERPRETATION  CT CHEST

The following report is an over-read performed by radiologist Dr.
Asdasdasdsa H-Lee [REDACTED] on 11/09/2021. This over-read
does not include interpretation of cardiac or coronary anatomy or
pathology. The calcium score interpretation by the cardiologist is
attached.
CLINICAL DATA: Cardiovascular Disease Risk stratification
Coronary Calcium Score
TECHNIQUE: A gated, non-contrast computed tomography scan of the heart was
performed using 3mm slice thickness. Axial images were analyzed on a
dedicated workstation. Calcium scoring of the coronary arteries was
performed using the Agatston method.

[Series 2: cascseq 3.0 qr36 70% · axial · 0.70mm/px · z∈[+1269,+1323]mm · 2 of 56 slices shown]
[im 19/56  vessel]
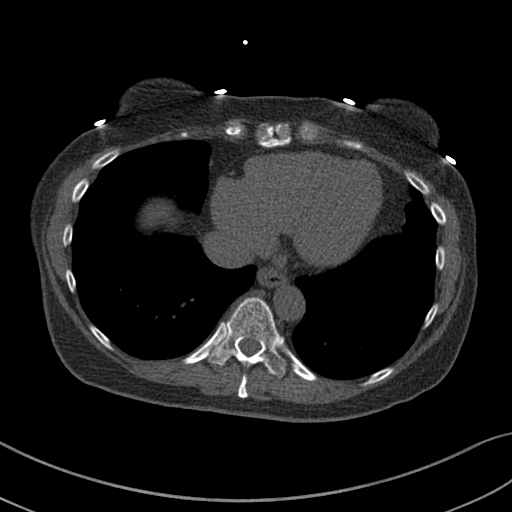
[im 37/56  vessel]
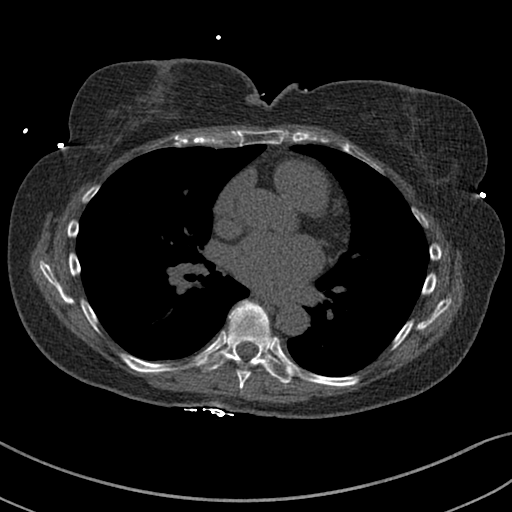

[Series 4: ax lung · axial · 0.72mm/px · z∈[+1236,+1356]mm · 6 of 85 slices shown, 8 images]
[im 13/85  vessel]
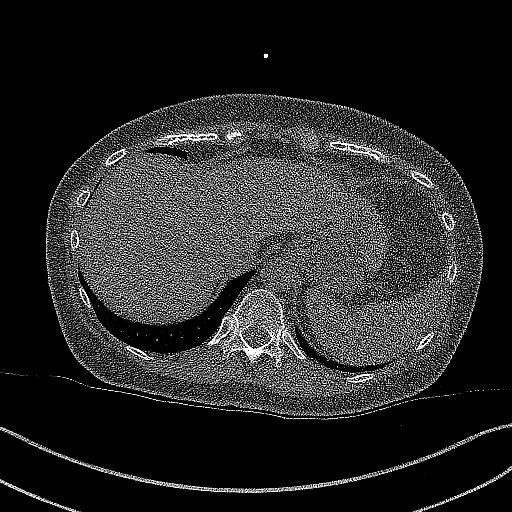
[im 13/85  lung]
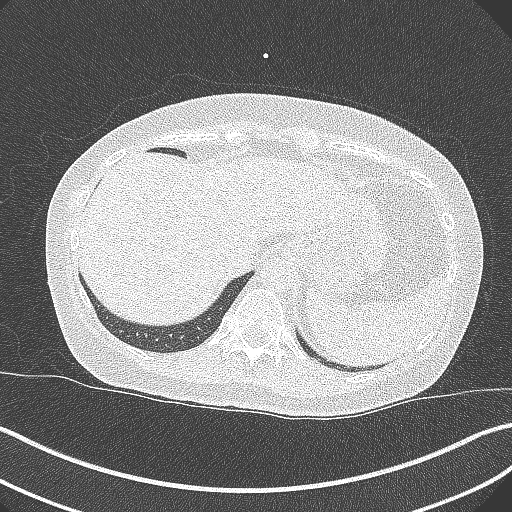
[im 25/85  vessel]
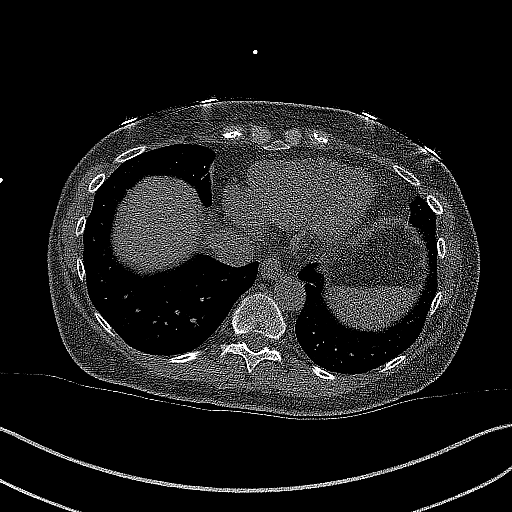
[im 37/85  vessel]
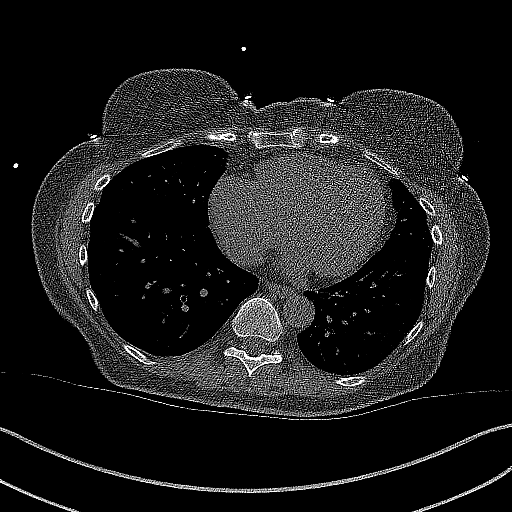
[im 49/85  vessel]
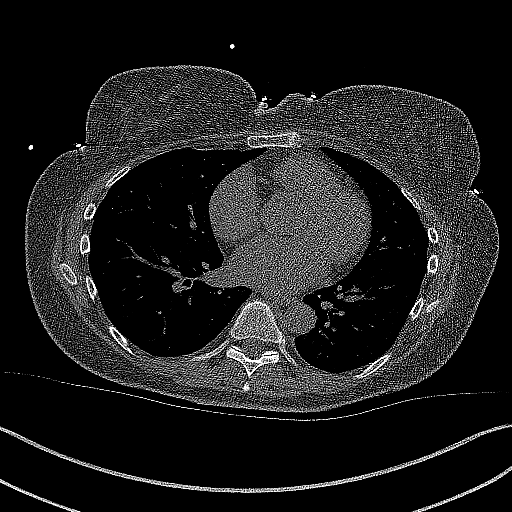
[im 61/85  vessel]
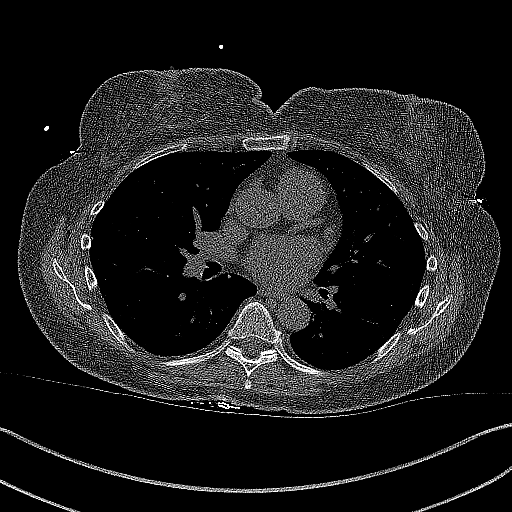
[im 61/85  lung]
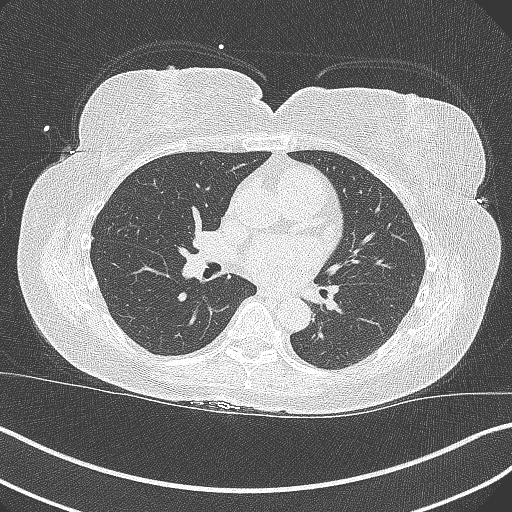
[im 73/85  vessel]
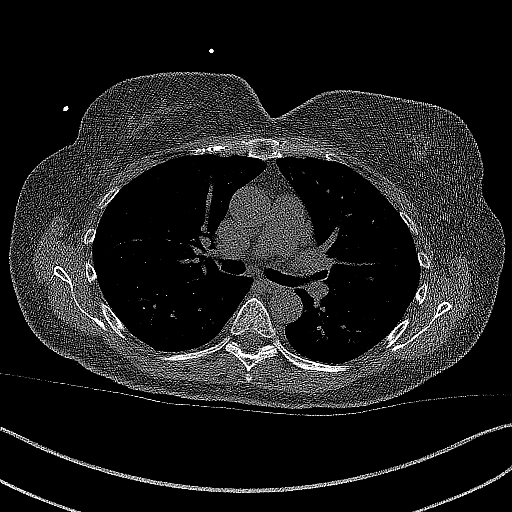

[8 of 20 positions shown; findings below may reference images not displayed]

FINDINGS: Vascular: Normal aortic caliber.

Mediastinum/Nodes: No imaged thoracic adenopathy.

Lungs/Pleura: No pleural fluid.  Clear imaged lungs.

Upper Abdomen: Normal imaged portions of the liver, spleen, stomach.

Musculoskeletal: No acute osseous abnormality.
IMPRESSION: No acute findings in the imaged extracardiac chest.
FINDINGS: Coronary arteries: Normal origins.

Coronary Calcium Score: 0

Pericardium: Normal.

Ascending Aorta: Normal caliber.

Non-cardiac: See separate report from [REDACTED].
IMPRESSION: Coronary calcium score of 0.



If CAC=0, it is reasonable to withhold statin therapy and reassess
in 5 to 10 years, as long as higher risk conditions are absent
(diabetes mellitus, family history of premature CHD in first degree
relatives (males <55 years; females <65 years), cigarette smoking,
or LDL >=190 mg/dL).

If CAC is 1 to 99, it is reasonable to initiate statin therapy for
patients >=55 years of age.

If CAC is >=100 or >=75th percentile, it is reasonable to initiate
statin therapy at any age.

Cardiology referral should be considered for patients with CAC
scores >=400 or >=75th percentile.

*5610 AHA/ACC/AACVPR/AAPA/ABC/SANATORIO/ATH-HAR/LENA-MARIA/Hircze/FLORES ABREU/KALAYJIAN/SUAID
Guideline on the Management of Blood Cholesterol: A Report of the
American College of Cardiology/American Heart Association Task Force
on Clinical Practice Guidelines. J Am Coll Cardiol.
6273;73(24):9191-9789.

*** End of Addendum ***
EXAM:
OVER-READ INTERPRETATION  CT CHEST

The following report is an over-read performed by radiologist Dr.
Asdasdasdsa H-Lee [REDACTED] on 11/09/2021. This over-read
does not include interpretation of cardiac or coronary anatomy or
pathology. The calcium score interpretation by the cardiologist is
attached.
FINDINGS: Vascular: Normal aortic caliber.

Mediastinum/Nodes: No imaged thoracic adenopathy.

Lungs/Pleura: No pleural fluid.  Clear imaged lungs.

Upper Abdomen: Normal imaged portions of the liver, spleen, stomach.

Musculoskeletal: No acute osseous abnormality.
IMPRESSION: No acute findings in the imaged extracardiac chest.

## 2024-04-09 ENCOUNTER — Other Ambulatory Visit (HOSPITAL_COMMUNITY): Payer: Self-pay

## 2024-04-10 ENCOUNTER — Other Ambulatory Visit: Payer: Self-pay

## 2024-04-10 ENCOUNTER — Encounter: Payer: Self-pay | Admitting: Pharmacist

## 2024-04-12 ENCOUNTER — Other Ambulatory Visit (HOSPITAL_BASED_OUTPATIENT_CLINIC_OR_DEPARTMENT_OTHER): Payer: Self-pay

## 2024-04-12 ENCOUNTER — Other Ambulatory Visit (HOSPITAL_COMMUNITY): Payer: Self-pay

## 2024-04-13 ENCOUNTER — Other Ambulatory Visit: Payer: Self-pay

## 2024-05-02 NOTE — Progress Notes (Deleted)
 Cardiology Office Note:  .   Date:  05/02/2024  ID:  Loretta Carpenter, DOB 07-03-1961, MRN [id number] PCP: Sheryle Carwin, MD  [facility] Providers Cardiologist:  Wilbert Bihari, MD  History of Present Illness: .   Loretta Carpenter is a 63 y.o. female  with PMHx of PVCs (since 63 y/o; monitor 12/2019: mainly NSR with 1 beat run of NSVT), HLD, CAC score of 0 in 10/2021, OSA and mild pulmonary hypertension (ECHO 12/2019) who reports to [facility]  office for follow up.   Last seen in heartcare OV 05/10/2023 with Loretta Crate, NP for follow-up.  Reported palpitations when under stress.  Otherwise doing well from cardiac standpoint without any complaints.  Discontinued atorvastatin  with ASCVD score not requiring statins.  Patient prefers red yeast rice regimen and lifestyle modifications. Continued on Toprol -XL 25 mg daily and Lopressor  12.5-25 mg as needed.  Ordered follow-up labs FLP and LFT but not completed.  Today, reports ### and denies ###.  Reports compliance with medications.  Dietary habitats:  Activity level:  Social: Denies tobacco use/alcohol/drug use  Denies any recent hospitalizations or visits to the emergency department.   PVC's (premature ventricular contractions) Palpitations History of PVCs since 20.  Event monitor 12/2019 showed 1 isolated episode of 4 beats of NSVT.  Started on Toprol  XL 25 mg daily Echo 12/2019 showed hyperdynamic LV function with EF > 75% Continue Toprol -XL 25 mg daily and Lopressor  12.5-25 mg as needed.   Hyperlipidemia, LDL goal < 100 CT cardiac scoring 10/2021 showed coronary calcium  score of 0. LDL 130 & LFT WNL in 08/2022.  Last OV 05/2023, discontinued atorvastatin  with ASCVD score not requiring statins.  Patient prefers red yeast rice regimen and lifestyle modifications.   OSA (obstructive sleep apnea) Due to excessive daytime sleepiness and snoring she underwent a sleep study showing moderate OSA with an AHI of 20/hr and was placed on auto CPAP from 4-15cm  H2o.  Endorses daily compliance with CPAP  Pulmonary HTN (HCC) Echo 12/2022: Mild pulmonary HTN Most likely secondary to OSA  ROS: 10 point review of system has been reviewed and considered negative except ones been listed in the HPI.   Studies Reviewed: SABRA   ECHO IMPRESSIONS 12/2019  1. Left ventricular ejection fraction, by estimation, is >75%. The left  ventricle has hyperdynamic function. The left ventricle has no regional  wall motion abnormalities. There is mild left ventricular hypertrophy.  Left ventricular diastolic parameters  are indeterminate.   2. Right ventricular systolic function is normal. The right ventricular  size is normal. There is mildly elevated pulmonary artery systolic  pressure.   3. The mitral valve is normal in structure. No evidence of mitral valve  regurgitation. No evidence of mitral stenosis.   4. The aortic valve was not well visualized. Aortic valve regurgitation  is not visualized. No aortic stenosis is present.   5. The inferior vena cava is normal in size with greater than 50%  respiratory variability, suggesting right atrial pressure of 3 mmHg.   Heart monitor 12/2019 Predominantly sinus rhythm, avg HR 78 bpm. Isolated 4-beat run of non-sustained ventricular tachycardia. Very brief atrial run seen. No sustained arrhythmias.   CT cardiac scoring 10/2021 IMPRESSION: Coronary calcium  score of 0. Risk Assessment/Calculations:   {Does this patient have ATRIAL FIBRILLATION?:[phone]} No BP recorded.  {Refresh Note OR Click here to enter BP  :1}***       Physical Exam:   VS:  There were no vitals taken for this  visit.   Wt Readings from Last 3 Encounters:  05/10/23 158 lb (71.7 kg)  01/01/22 142 lb 12.8 oz (64.8 kg)  09/21/21 150 lb 12.8 oz (68.4 kg)    GEN: Well nourished, well developed in no acute distress while sitting in chair.  NECK: No JVD; No carotid bruits CARDIAC: ***RRR, no murmurs, rubs, gallops RESPIRATORY:  Clear to auscultation  without rales, wheezing or rhonchi  ABDOMEN: Soft, non-tender, non-distended EXTREMITIES:  No edema; No deformity   ASSESSMENT AND PLAN: .   ***    {Are you ordering a CV Procedure (e.g. stress test, cath, DCCV, TEE, etc)?   Press F2        :[id number]}  Dispo: ***  Signed, Lorette CINDERELLA Kapur, PA-C

## 2024-05-04 ENCOUNTER — Ambulatory Visit: Admitting: Nurse Practitioner

## 2024-05-04 DIAGNOSIS — I493 Ventricular premature depolarization: Secondary | ICD-10-CM

## 2024-05-04 DIAGNOSIS — R002 Palpitations: Secondary | ICD-10-CM

## 2024-05-04 DIAGNOSIS — I272 Pulmonary hypertension, unspecified: Secondary | ICD-10-CM

## 2024-05-04 DIAGNOSIS — G4733 Obstructive sleep apnea (adult) (pediatric): Secondary | ICD-10-CM

## 2024-05-04 DIAGNOSIS — E785 Hyperlipidemia, unspecified: Secondary | ICD-10-CM

## 2024-07-02 ENCOUNTER — Ambulatory Visit: Admitting: Nurse Practitioner

## 2024-07-06 ENCOUNTER — Other Ambulatory Visit (HOSPITAL_BASED_OUTPATIENT_CLINIC_OR_DEPARTMENT_OTHER): Payer: Self-pay

## 2024-07-10 ENCOUNTER — Other Ambulatory Visit (HOSPITAL_BASED_OUTPATIENT_CLINIC_OR_DEPARTMENT_OTHER): Payer: Self-pay

## 2024-07-10 ENCOUNTER — Telehealth: Payer: Self-pay | Admitting: *Deleted

## 2024-07-10 MED ORDER — CLENPIQ 10-3.5-12 MG-GM -GM/175ML PO SOLN
1.0000 | ORAL | 0 refills | Status: AC
Start: 2024-07-10 — End: ?
  Filled 2024-07-10 – 2024-07-25 (×2): qty 350, 1d supply, fill #0

## 2024-07-10 NOTE — Addendum Note (Signed)
 Addended by: JEANELL GRAEME RAMAN on: 07/10/2024 03:48 PM   Modules accepted: Orders

## 2024-07-10 NOTE — Telephone Encounter (Signed)
 PT SCHEDULED FOR 12/29. AWARE WILL SEND INSTRUCTIONS. RX FOR PREP SENT TO PHARMACY

## 2024-07-10 NOTE — Telephone Encounter (Signed)
  Have you had a colonoscopy before?  2015  Do you have family history of colon cancer?  no  Do you have a family history of polyps? Yes mother, father  Previous colonoscopy with polyps removed? no  Do you have a history colorectal cancer?   no  Are you diabetic?  no  Do you have a prosthetic or mechanical heart valve? no  Do you have a pacemaker/defibrillator?   no  Have you had endocarditis/atrial fibrillation?  no  Do you use supplemental oxygen/CPAP?  yes cpap  Have you had joint replacement within the last 12 months?  no  Do you tend to be constipated or have to use laxatives?  no   Do you have history of alcohol use? If yes, how much and how often.  no  Do you have history or are you using drugs? If yes, what do are you  using?  no  Have you ever had a stroke/heart attack?  no  Have you ever had a heart or other vascular stent placed,?no  Do you take weight loss medication? no  female patients,: have you had a hysterectomy? no                           do you still have your menstrual cycle? no      Do you take any blood-thinning medications such as: (Plavix, aspirin, Coumadin, Aggrenox, Brilinta, Xarelto, Eliquis, Pradaxa, Savaysa or Effient)? no  Current Outpatient Medications  Medication Sig Dispense Refill   acetaminophen  (TYLENOL ) 500 MG tablet Take 500 mg by mouth as needed for mild pain or moderate pain.      Ascorbic Acid (VITAMIN C PO) Take 1 tablet by mouth daily.     divalproex (DEPAKOTE ER) 250 MG 24 hr tablet Take 250 mg by mouth at bedtime.     escitalopram  (LEXAPRO ) 10 MG tablet Take 1/2 tablet by mouth daily for 6 days and then Take 1 tablet (10 mg total) by mouth daily. 30 tablet 3   metoprolol  succinate (TOPROL -XL) 25 MG 24 hr tablet Take 1 tablet (25 mg total) by mouth daily. Must schedule appointment for refills 90 tablet 3   metoprolol  tartrate (LOPRESSOR ) 25 MG tablet Take 0.5-1 tablets (12.5-25 mg total) by mouth as needed for palpitations  or heart rate greater than 150 bpm (sustained rate) 30 tablet 11   promethazine  (PHENERGAN ) 25 MG suppository Insert 1 suppository (25 mg total) rectally every 4-6 hours as needed 12 suppository 2   promethazine  (PHENERGAN ) 25 MG tablet Take 1 tablet (25 mg total) by mouth every 6 (six) hours. 20 tablet 1   UNABLE TO FIND Take 1 tablet by mouth daily. Tumeric      VITAMIN D  PO 5,000 IU by mouth daily     Zinc 220 (50 Zn) MG CAPS Take 1 capsule by mouth daily.     escitalopram  (LEXAPRO ) 10 MG tablet Take 1 tablet (10 mg total) by mouth daily. 90 tablet 4   No current facility-administered medications for this visit.    Allergies  Allergen Reactions   Penicillins Hives

## 2024-07-16 ENCOUNTER — Other Ambulatory Visit (HOSPITAL_BASED_OUTPATIENT_CLINIC_OR_DEPARTMENT_OTHER): Payer: Self-pay

## 2024-07-19 ENCOUNTER — Other Ambulatory Visit (HOSPITAL_BASED_OUTPATIENT_CLINIC_OR_DEPARTMENT_OTHER): Payer: Self-pay

## 2024-07-25 ENCOUNTER — Other Ambulatory Visit: Payer: Self-pay

## 2024-07-25 ENCOUNTER — Other Ambulatory Visit (HOSPITAL_BASED_OUTPATIENT_CLINIC_OR_DEPARTMENT_OTHER): Payer: Self-pay

## 2024-07-25 MED ORDER — DIVALPROEX SODIUM ER 250 MG PO TB24
250.0000 mg | ORAL_TABLET | Freq: Every day | ORAL | 5 refills | Status: AC
  Filled 2024-07-25: qty 30, 30d supply, fill #0
  Filled 2024-10-04 (×2): qty 30, 30d supply, fill #1

## 2024-07-31 NOTE — Telephone Encounter (Signed)
 Pt needed to reschedule procedure on 12/29 due to work schedule. She is asking to be rescheduled in Jan. Pt states she would really like to have 1/26 if possible, but can also do 1/19-1/23. Advised pt that will given her a call once we get providers schedule for Jan.

## 2024-08-01 ENCOUNTER — Other Ambulatory Visit (HOSPITAL_BASED_OUTPATIENT_CLINIC_OR_DEPARTMENT_OTHER): Payer: Self-pay

## 2024-08-09 ENCOUNTER — Encounter: Payer: Self-pay | Admitting: *Deleted

## 2024-08-16 DIAGNOSIS — M79675 Pain in left toe(s): Secondary | ICD-10-CM | POA: Diagnosis not present

## 2024-08-16 DIAGNOSIS — L600 Ingrowing nail: Secondary | ICD-10-CM | POA: Diagnosis not present

## 2024-08-16 DIAGNOSIS — L03032 Cellulitis of left toe: Secondary | ICD-10-CM | POA: Diagnosis not present

## 2024-08-24 ENCOUNTER — Ambulatory Visit: Admitting: Cardiology

## 2024-08-27 ENCOUNTER — Ambulatory Visit (HOSPITAL_COMMUNITY): Admit: 2024-08-27 | Admitting: Internal Medicine

## 2024-08-27 ENCOUNTER — Encounter (HOSPITAL_COMMUNITY): Payer: Self-pay

## 2024-08-27 SURGERY — COLONOSCOPY
Anesthesia: Choice

## 2024-09-17 ENCOUNTER — Ambulatory Visit: Attending: Cardiology | Admitting: Cardiology

## 2024-09-17 ENCOUNTER — Telehealth: Payer: Self-pay | Admitting: *Deleted

## 2024-09-17 ENCOUNTER — Encounter: Payer: Self-pay | Admitting: Cardiology

## 2024-09-17 ENCOUNTER — Other Ambulatory Visit (HOSPITAL_COMMUNITY): Payer: Self-pay

## 2024-09-17 ENCOUNTER — Other Ambulatory Visit: Payer: Self-pay

## 2024-09-17 ENCOUNTER — Other Ambulatory Visit (HOSPITAL_BASED_OUTPATIENT_CLINIC_OR_DEPARTMENT_OTHER): Payer: Self-pay

## 2024-09-17 VITALS — BP 120/80 | HR 62 | Ht 64.0 in | Wt 171.6 lb

## 2024-09-17 DIAGNOSIS — I272 Pulmonary hypertension, unspecified: Secondary | ICD-10-CM

## 2024-09-17 DIAGNOSIS — G4733 Obstructive sleep apnea (adult) (pediatric): Secondary | ICD-10-CM | POA: Diagnosis not present

## 2024-09-17 DIAGNOSIS — I493 Ventricular premature depolarization: Secondary | ICD-10-CM | POA: Diagnosis not present

## 2024-09-17 DIAGNOSIS — R002 Palpitations: Secondary | ICD-10-CM

## 2024-09-17 MED ORDER — METOPROLOL SUCCINATE ER 25 MG PO TB24
25.0000 mg | ORAL_TABLET | Freq: Every day | ORAL | 3 refills | Status: AC
  Filled 2024-09-17 (×2): qty 90, 90d supply, fill #0

## 2024-09-17 MED ORDER — METOPROLOL TARTRATE 25 MG PO TABS
12.5000 mg | ORAL_TABLET | Freq: Every day | ORAL | 11 refills | Status: AC | PRN
  Filled 2024-09-17: qty 90, 90d supply, fill #0
  Filled 2024-09-17: qty 30, 30d supply, fill #0

## 2024-09-17 NOTE — Progress Notes (Signed)
 "     Clinical Summary Loretta Carpenter is a 64 y.o.female last seen by NP Miriam, this is our first visit together. Seen for the following medical problems.  1.PVCs - long history per notes, diagnosed age 58 -Event monitor in May 2021 showed 1 isolated episode of 4 beats of ventricular tachycardia and she was started on Toprol -XL 25 mg daily  - 2021 echo LVEF 75%, no WMAs, normal RV  - reports some recent palpitations, recent URI - limiting caffeine - taking toprol  daily, has prn lopressor  but has not needed  2.HLD 2023 CT cardiac scoring showed coronary calcium  score of 0  - has not been in favor of statin  3. OSA - mixed compliance   4. Pulmonary HTN - 2021 echo PASP 38, normal RV - from Dr Dorine note plan to repeat echo Jan 2024 once she became accustomed to PAP therapy   SH: Works as engineer, civil (consulting) at [employer] Past Medical History:  Diagnosis Date   Frequent PVCs    dx age 35   H/O renal calculi    HLD (hyperlipidemia)    Migraine    OSA on CPAP    moderate with AHI 20/hr now on CPAP   Palpitations 2021   short runs ventricular and atrial ectopy on outpt monitor   Pulmonary HTN (HCC)    mild by echo 2022 and likely related to OSA     Allergies[1]   Current Outpatient Medications  Medication Sig Dispense Refill   acetaminophen  (TYLENOL ) 500 MG tablet Take 500 mg by mouth as needed for mild pain or moderate pain.      Ascorbic Acid (VITAMIN C PO) Take 1 tablet by mouth daily.     divalproex  (DEPAKOTE  ER) 250 MG 24 hr tablet Take 250 mg by mouth at bedtime.     divalproex  (DEPAKOTE  ER) 250 MG 24 hr tablet Take 1 tablet (250 mg total) by mouth at bedtime. 30 tablet 5   escitalopram  (LEXAPRO ) 10 MG tablet Take 1/2 tablet by mouth daily for 6 days and then Take 1 tablet (10 mg total) by mouth daily. 30 tablet 3   escitalopram  (LEXAPRO ) 10 MG tablet Take 1 tablet (10 mg total) by mouth daily. 90 tablet 4   metoprolol  succinate (TOPROL -XL) 25 MG 24 hr tablet Take 1 tablet (25  mg total) by mouth daily. Must schedule appointment for refills 90 tablet 3   metoprolol  tartrate (LOPRESSOR ) 25 MG tablet Take 0.5-1 tablets (12.5-25 mg total) by mouth as needed for palpitations or heart rate greater than 150 bpm (sustained rate) 30 tablet 11   promethazine  (PHENERGAN ) 25 MG suppository Insert 1 suppository (25 mg total) rectally every 4-6 hours as needed 12 suppository 2   promethazine  (PHENERGAN ) 25 MG tablet Take 1 tablet (25 mg total) by mouth every 6 (six) hours. 20 tablet 1   Sod Picosulfate-Mag Ox-Cit Acd (CLENPIQ ) 10-3.5-12 MG-GM -GM/175ML SOLN Take 1 kit by mouth as directed. 350 mL 0   UNABLE TO FIND Take 1 tablet by mouth daily. Tumeric      VITAMIN D  PO 5,000 IU by mouth daily     Zinc 220 (50 Zn) MG CAPS Take 1 capsule by mouth daily.     No current facility-administered medications for this visit.     Past Surgical History:  Procedure Laterality Date   CESAREAN SECTION     x3   COLONOSCOPY N/A 07/29/2014   Procedure: COLONOSCOPY;  Surgeon: Claudis RAYMOND Rivet, MD;  Location: [facility];  Service: Endoscopy;  Laterality: N/A;  730 - moved to 11/30 @ 7:30   LITHOTRIPSY     SALPINGOOPHORECTOMY Left      Allergies[2]    Family History  Problem Relation Age of Onset   Arthritis Mother    Hypertension Father    Arthritis Father      Social History Loretta Carpenter reports that she has never smoked. She has never used smokeless tobacco. Loretta Carpenter reports no history of alcohol use.    Physical Examination Today's Vitals   09/17/24 0832  BP: 120/80  Pulse: 62  SpO2: 96%  Weight: 171 lb 9.6 oz (77.8 kg)  Height: 5' 4 (1.626 m)   Body mass index is 29.46 kg/m.  Gen: resting comfortably, no acute distress HEENT: no scleral icterus, pupils equal round and reactive, no palptable cervical adenopathy,  CV: RRR, no mrg, no jvd Resp: Clear to auscultation bilaterally GI: abdomen is soft, non-tender, non-distended, normal bowel sounds, no  hepatosplenomegaly MSK: extremities are warm, no edema.  Skin: warm, no rash Neuro:  no focal deficits Psych: appropriate affect   Diagnostic Studies  Echo 12/2019: 1. Left ventricular ejection fraction, by estimation, is >75%. The left  ventricle has hyperdynamic function. The left ventricle has no regional  wall motion abnormalities. There is mild left ventricular hypertrophy.  Left ventricular diastolic parameters  are indeterminate.   2. Right ventricular systolic function is normal. The right ventricular  size is normal. There is mildly elevated pulmonary artery systolic  pressure.   3. The mitral valve is normal in structure. No evidence of mitral valve  regurgitation. No evidence of mitral stenosis.   4. The aortic valve was not well visualized. Aortic valve regurgitation  is not visualized. No aortic stenosis is present.   5. The inferior vena cava is normal in size with greater than 50%  respiratory variability, suggesting right atrial pressure of 3 mmHg.   Assessment and Plan  1.PVCs - overall doing well, isolated very mild episode in setting of current URI - continue current meds - EKG today shows NSR  2. Pulmonary HTN - very mild by echo in 2021, thought secondary to OSA - plans to repeat echo, she would like to work on weight loss and improved compliance with cpap before we repeat study, she will contact us  in a few months to order  3. OSA - needs to establish with sleep provider, refer to [facility]  F/u 1 year    Dorn PHEBE Ross, M.D.     [1]  Allergies Allergen Reactions   Penicillins Hives  [2]  Allergies Allergen Reactions   Penicillins Hives   "

## 2024-09-17 NOTE — Telephone Encounter (Signed)
 Patient scheduled for 1/22 with Dr. Shaaron, she is sick, SOB, cough and she spoke with Dr. Rebecka and he felt she should reschedule. Moved to 10/18/24 at 7:30am.

## 2024-09-17 NOTE — Patient Instructions (Signed)
 Medication Instructions:  Your physician recommends that you continue on your current medications as directed. Please refer to the Current Medication list given to you today.  *If you need a refill on your cardiac medications before your next appointment, please call your pharmacy*  Lab Work: None If you have labs (blood work) drawn today and your tests are completely normal, you will receive your results only by: MyChart Message (if you have MyChart) OR A paper copy in the mail If you have any lab test that is abnormal or we need to change your treatment, we will call you to review the results.  Testing/Procedures: None  Follow-Up: At [facility], you and your health needs are our priority.  As part of our continuing mission to provide you with exceptional heart care, our providers are all part of one team.  This team includes your primary Cardiologist (physician) and Advanced Practice Providers or APPs (Physician Assistants and Nurse Practitioners) who all work together to provide you with the care you need, when you need it.  Your next appointment:   1 year(s)  Provider:   You may see Dorn Ross, MD or one of the following Advanced Practice Providers on your designated Care Team:   Laymon Qua, PA-C  [place], [place] Olivia [place], [place]     We recommend signing up for the patient portal called MyChart.  Sign up information is provided on this After Visit Summary.  MyChart is used to connect with patients for Virtual Visits (Telemedicine).  Patients are able to view lab/test results, encounter notes, upcoming appointments, etc.  Non-urgent messages can be sent to your provider as well.   To learn more about what you can do with MyChart, go to [email or web address].   Other Instructions You have been referred to [facility]. They will call you with your first appointment.

## 2024-10-04 ENCOUNTER — Other Ambulatory Visit (HOSPITAL_BASED_OUTPATIENT_CLINIC_OR_DEPARTMENT_OTHER): Payer: Self-pay

## 2024-10-04 MED ORDER — ESCITALOPRAM OXALATE 10 MG PO TABS
10.0000 mg | ORAL_TABLET | Freq: Every day | ORAL | 0 refills | Status: AC
  Filled 2024-10-04: qty 30, 30d supply, fill #0

## 2024-10-04 MED ORDER — ESCITALOPRAM OXALATE 10 MG PO TABS
10.0000 mg | ORAL_TABLET | Freq: Every day | ORAL | 4 refills | Status: AC
  Filled 2024-10-04: qty 90, 90d supply, fill #0

## 2024-10-18 ENCOUNTER — Ambulatory Visit (HOSPITAL_COMMUNITY): Admission: RE | Admit: 2024-10-18 | Source: Home / Self Care | Admitting: Internal Medicine

## 2024-10-18 ENCOUNTER — Encounter (HOSPITAL_COMMUNITY): Admission: RE | Payer: Self-pay | Source: Home / Self Care

## 2024-10-18 SURGERY — COLONOSCOPY
Anesthesia: Choice

## 2024-12-03 ENCOUNTER — Ambulatory Visit: Admitting: Pulmonary Disease
